# Patient Record
Sex: Female | Born: 1990 | Race: White | Hispanic: No | Marital: Married | Smoking: Never smoker
Health system: Southern US, Community
[De-identification: ages and names within clinical notes are randomized; demographics above are authoritative.]

## PROBLEM LIST (undated history)

## (undated) DIAGNOSIS — J45909 Unspecified asthma, uncomplicated: Secondary | ICD-10-CM

## (undated) HISTORY — DX: Unspecified asthma, uncomplicated: J45.909

## (undated) HISTORY — PX: WISDOM TOOTH EXTRACTION: SHX21

---
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
## 2010-06-21 LAB — URINALYSIS, ROUTINE W REFLEX MICROSCOPIC
Bilirubin Urine: NEGATIVE
Glucose, UA: NEGATIVE mg/dL
Ketones, ur: NEGATIVE mg/dL
Nitrite: NEGATIVE
Protein, ur: NEGATIVE mg/dL
pH: 6.5 (ref 5.0–8.0)

## 2010-06-21 LAB — RPR: RPR Ser Ql: NONREACTIVE

## 2010-06-21 LAB — CBC
HCT: 32.6 % — ABNORMAL LOW (ref 36.0–46.0)
Hemoglobin: 11.4 g/dL — ABNORMAL LOW (ref 12.0–15.0)
MCHC: 33.6 g/dL (ref 30.0–36.0)
MCV: 87.2 fL (ref 78.0–100.0)
Platelets: 224 10*3/uL (ref 150–400)
RDW: 14.8 % (ref 11.5–15.5)
WBC: 8.4 10*3/uL (ref 4.0–10.5)

## 2010-06-21 LAB — COMPREHENSIVE METABOLIC PANEL
ALT: 13 U/L (ref 0–35)
Alkaline Phosphatase: 165 U/L — ABNORMAL HIGH (ref 39–117)
BUN: 12 mg/dL (ref 6–23)
CO2: 21 mEq/L (ref 19–32)
GFR calc non Af Amer: >60 mL/min (ref >60)
Glucose, Bld: 88 mg/dL (ref 70–99)
Potassium: 4.1 mEq/L (ref 3.5–5.1)
Sodium: 135 mEq/L (ref 135–145)
Total Bilirubin: 0.3 mg/dL (ref 0.3–1.2)

## 2010-06-21 LAB — URINE MICROSCOPIC-ADD ON

## 2016-06-01 ENCOUNTER — Other Ambulatory Visit: Payer: Self-pay | Admitting: Obstetrics & Gynecology

## 2016-06-01 DIAGNOSIS — O3680X0 Pregnancy with inconclusive fetal viability, not applicable or unspecified: Secondary | ICD-10-CM

## 2016-06-02 ENCOUNTER — Ambulatory Visit (INDEPENDENT_AMBULATORY_CARE_PROVIDER_SITE_OTHER): Payer: Medicaid Other

## 2016-06-02 DIAGNOSIS — Z3A08 8 weeks gestation of pregnancy: Secondary | ICD-10-CM

## 2016-06-02 DIAGNOSIS — O3491 Maternal care for abnormality of pelvic organ, unspecified, first trimester: Secondary | ICD-10-CM

## 2016-06-02 DIAGNOSIS — O3680X0 Pregnancy with inconclusive fetal viability, not applicable or unspecified: Secondary | ICD-10-CM | POA: Diagnosis not present

## 2016-06-02 NOTE — Progress Notes (Signed)
US 8+3 wks,single IUP w/ys, pos fht 162 bpm,normal right ovary,simple left corpus luteal cyst 3 x 2.4 x 2.5 cm,EDD 01/09/2017 by [facility]

## 2016-06-16 ENCOUNTER — Encounter: Payer: Self-pay | Admitting: Advanced Practice Midwife

## 2016-06-16 ENCOUNTER — Telehealth: Payer: Self-pay | Admitting: *Deleted

## 2016-06-16 ENCOUNTER — Ambulatory Visit (INDEPENDENT_AMBULATORY_CARE_PROVIDER_SITE_OTHER): Payer: Medicaid Other | Admitting: Advanced Practice Midwife

## 2016-06-16 ENCOUNTER — Ambulatory Visit: Payer: Medicaid Other | Admitting: *Deleted

## 2016-06-16 VITALS — BP 118/86 | HR 92 | Ht 60.0 in | Wt 162.0 lb

## 2016-06-16 DIAGNOSIS — Z3A10 10 weeks gestation of pregnancy: Secondary | ICD-10-CM | POA: Diagnosis not present

## 2016-06-16 DIAGNOSIS — Z1389 Encounter for screening for other disorder: Secondary | ICD-10-CM

## 2016-06-16 DIAGNOSIS — O99511 Diseases of the respiratory system complicating pregnancy, first trimester: Secondary | ICD-10-CM

## 2016-06-16 DIAGNOSIS — O210 Mild hyperemesis gravidarum: Secondary | ICD-10-CM

## 2016-06-16 DIAGNOSIS — Z3480 Encounter for supervision of other normal pregnancy, unspecified trimester: Secondary | ICD-10-CM | POA: Insufficient documentation

## 2016-06-16 DIAGNOSIS — J4520 Mild intermittent asthma, uncomplicated: Secondary | ICD-10-CM

## 2016-06-16 DIAGNOSIS — Z3481 Encounter for supervision of other normal pregnancy, first trimester: Secondary | ICD-10-CM

## 2016-06-16 DIAGNOSIS — J45909 Unspecified asthma, uncomplicated: Secondary | ICD-10-CM | POA: Insufficient documentation

## 2016-06-16 DIAGNOSIS — Z331 Pregnant state, incidental: Secondary | ICD-10-CM

## 2016-06-16 LAB — POCT URINALYSIS DIPSTICK
Blood, UA: NEGATIVE
GLUCOSE UA: NEGATIVE
Ketones, UA: NEGATIVE
Leukocytes, UA: NEGATIVE
NITRITE UA: NEGATIVE

## 2016-06-16 NOTE — Telephone Encounter (Signed)
She is only 10 weeks, we talked about risks and although she is ok with them, I am not. She can ask the person wh Robynn Paneorigianlly gave it to her ([facility]?) if they want to refill it

## 2016-06-16 NOTE — Patient Instructions (Signed)
 First Trimester of Pregnancy The first trimester of pregnancy is from week 1 until the end of week 12 (months 1 through 3). A week after a sperm fertilizes an egg, the egg will implant on the wall of the uterus. This embryo will begin to develop into a baby. Genes from you and your partner are forming the baby. The female genes determine whether the baby is a boy or a girl. At 6-8 weeks, the eyes and face are formed, and the heartbeat can be seen on ultrasound. At the end of 12 weeks, all the baby's organs are formed.  Now that you are pregnant, you will want to do everything you can to have a healthy baby. Two of the most important things are to get good prenatal care and to follow your health care provider's instructions. Prenatal care is all the medical care you receive before the baby's birth. This care will help prevent, find, and treat any problems during the pregnancy and childbirth. BODY CHANGES Your body goes through many changes during pregnancy. The changes vary from woman to woman.   You may gain or lose a couple of pounds at first.  You may feel sick to your stomach (nauseous) and throw up (vomit). If the vomiting is uncontrollable, call your health care provider.  You may tire easily.  You may develop headaches that can be relieved by medicines approved by your health care provider.  You may urinate more often. Painful urination may mean you have a bladder infection.  You may develop heartburn as a result of your pregnancy.  You may develop constipation because certain hormones are causing the muscles that push waste through your intestines to slow down.  You may develop hemorrhoids or swollen, bulging veins (varicose veins).  Your breasts may begin to grow larger and become tender. Your nipples may stick out more, and the tissue that surrounds them (areola) may become darker.  Your gums may bleed and may be sensitive to brushing and flossing.  Dark spots or blotches  (chloasma, mask of pregnancy) may develop on your face. This will likely fade after the baby is born.  Your menstrual periods will stop.  You may have a loss of appetite.  You may develop cravings for certain kinds of food.  You may have changes in your emotions from day to day, such as being excited to be pregnant or being concerned that something may go wrong with the pregnancy and baby.  You may have more vivid and strange dreams.  You may have changes in your hair. These can include thickening of your hair, rapid growth, and changes in texture. Some women also have hair loss during or after pregnancy, or hair that feels dry or thin. Your hair will most likely return to normal after your baby is born. WHAT TO EXPECT AT YOUR PRENATAL VISITS During a routine prenatal visit:  You will be weighed to make sure you and the baby are growing normally.  Your blood pressure will be taken.  Your abdomen will be measured to track your baby's growth.  The fetal heartbeat will be listened to starting around week 10 or 12 of your pregnancy.  Test results from any previous visits will be discussed. Your health care provider may ask you:  How you are feeling.  If you are feeling the baby move.  If you have had any abnormal symptoms, such as leaking fluid, bleeding, severe headaches, or abdominal cramping.  If you have any questions. Other   tests that may be performed during your first trimester include:  Blood tests to find your blood type and to check for the presence of any previous infections. They will also be used to check for low iron levels (anemia) and Rh antibodies. Later in the pregnancy, blood tests for diabetes will be done along with other tests if problems develop.  Urine tests to check for infections, diabetes, or protein in the urine.  An ultrasound to confirm the proper growth and development of the baby.  An amniocentesis to check for possible genetic problems.  Fetal  screens for spina bifida and Down syndrome.  You may need other tests to make sure you and the baby are doing well. HOME CARE INSTRUCTIONS  Medicines  Follow your health care provider's instructions regarding medicine use. Specific medicines may be either safe or unsafe to take during pregnancy.  Take your prenatal vitamins as directed.  If you develop constipation, try taking a stool softener if your health care provider approves. Diet  Eat regular, well-balanced meals. Choose a variety of foods, such as meat or vegetable-based protein, fish, milk and low-fat dairy products, vegetables, fruits, and whole grain breads and cereals. Your health care provider will help you determine the amount of weight gain that is right for you.  Avoid raw meat and uncooked cheese. These carry germs that can cause birth defects in the baby.  Eating four or five small meals rather than three large meals a day may help relieve nausea and vomiting. If you start to feel nauseous, eating a few soda crackers can be helpful. Drinking liquids between meals instead of during meals also seems to help nausea and vomiting.  If you develop constipation, eat more high-fiber foods, such as fresh vegetables or fruit and whole grains. Drink enough fluids to keep your urine clear or pale yellow. Activity and Exercise  Exercise only as directed by your health care provider. Exercising will help you:  Control your weight.  Stay in shape.  Be prepared for labor and delivery.  Experiencing pain or cramping in the lower abdomen or low back is a good sign that you should stop exercising. Check with your health care provider before continuing normal exercises.  Try to avoid standing for long periods of time. Move your legs often if you must stand in one place for a long time.  Avoid heavy lifting.  Wear low-heeled shoes, and practice good posture.  You may continue to have sex unless your health care provider directs you  otherwise. Relief of Pain or Discomfort  Wear a good support bra for breast tenderness.   Take warm sitz baths to soothe any pain or discomfort caused by hemorrhoids. Use hemorrhoid cream if your health care provider approves.   Rest with your legs elevated if you have leg cramps or low back pain.  If you develop varicose veins in your legs, wear support hose. Elevate your feet for 15 minutes, 3-4 times a day. Limit salt in your diet. Prenatal Care  Schedule your prenatal visits by the twelfth week of pregnancy. They are usually scheduled monthly at first, then more often in the last 2 months before delivery.  Write down your questions. Take them to your prenatal visits.  Keep all your prenatal visits as directed by your health care provider. Safety  Wear your seat belt at all times when driving.  Make a list of emergency phone numbers, including numbers for family, friends, the hospital, and police and fire departments. General   Tips  Ask your health care provider for a referral to a local prenatal education class. Begin classes no later than at the beginning of month 6 of your pregnancy.  Ask for help if you have counseling or nutritional needs during pregnancy. Your health care provider can offer advice or refer you to specialists for help with various needs.  Do not use hot tubs, steam rooms, or saunas.  Do not douche or use tampons or scented sanitary pads.  Do not cross your legs for long periods of time.  Avoid cat litter boxes and soil used by cats. These carry germs that can cause birth defects in the baby and possibly loss of the fetus by miscarriage or stillbirth.  Avoid all smoking, herbs, alcohol, and medicines not prescribed by your health care provider. Chemicals in these affect the formation and growth of the baby.  Schedule a dentist appointment. At home, brush your teeth with a soft toothbrush and be gentle when you floss. SEEK MEDICAL CARE IF:   You have  dizziness.  You have mild pelvic cramps, pelvic pressure, or nagging pain in the abdominal area.  You have persistent nausea, vomiting, or diarrhea.  You have a bad smelling vaginal discharge.  You have pain with urination.  You notice increased swelling in your face, hands, legs, or ankles. SEEK IMMEDIATE MEDICAL CARE IF:   You have a fever.  You are leaking fluid from your vagina.  You have spotting or bleeding from your vagina.  You have severe abdominal cramping or pain.  You have rapid weight gain or loss.  You vomit blood or material that looks like coffee grounds.  You are exposed to German measles and have never had them.  You are exposed to fifth disease or chickenpox.  You develop a severe headache.  You have shortness of breath.  You have any kind of trauma, such as from a fall or a car accident. Document Released: 03/16/2001 Document Revised: 08/06/2013 Document Reviewed: 01/30/2013 Exit[employer] Patient Information 2015 [employer]. This information is not intended to replace advice given to you by your health care provider. Make sure you discuss any questions you have with your health care provider.   Nausea & Vomiting  Have saltine crackers or pretzels by your bed and eat a few bites before you raise your head out of bed in the morning  Eat small frequent meals throughout the day instead of large meals  Drink plenty of fluids throughout the day to stay hydrated, just don't drink a lot of fluids with your meals.  This can make your stomach fill up faster making you feel sick  Do not brush your teeth right after you eat  Products with real ginger are good for nausea, like ginger ale and ginger hard candy Make sure it says made with real ginger!  Sucking on sour candy like lemon heads is also good for nausea  If your prenatal vitamins make you nauseated, take them at night so you will sleep through the nausea  Sea Bands  If you feel like you need  medicine for the nausea & vomiting please let us know  If you are unable to keep any fluids or food down please let us know   Constipation  Drink plenty of fluid, preferably water, throughout the day  Eat foods high in fiber such as fruits, vegetables, and grains  Exercise, such as walking, is a good way to keep your bowels regular  Drink warm fluids, especially warm   prune juice, or decaf coffee  Eat a 1/2 cup of real oatmeal (not instant), 1/2 cup applesauce, and 1/2-1 cup warm prune juice every day  If needed, you may take Colace (docusate sodium) stool softener once or twice a day to help keep the stool soft. If you are pregnant, wait until you are out of your first trimester (12-14 weeks of pregnancy)  If you still are having problems with constipation, you may take Miralax once daily as needed to help keep your bowels regular.  If you are pregnant, wait until you are out of your first trimester (12-14 weeks of pregnancy)  Safe Medications in Pregnancy   Acne: Benzoyl Peroxide Salicylic Acid  Backache/Headache: Tylenol: 2 regular strength every 4 hours OR              2 Extra strength every 6 hours  Colds/Coughs/Allergies: Benadryl (alcohol free) 25 mg every 6 hours as needed Breath right strips Claritin Cepacol throat lozenges Chloraseptic throat spray Cold-Eeze- up to three times per day Cough drops, alcohol free Flonase (by prescription only) Guaifenesin Mucinex Robitussin DM (plain only, alcohol free) Saline nasal spray/drops Sudafed (pseudoephedrine) & Actifed ** use only after [redacted] weeks gestation and if you do not have high blood pressure Tylenol Vicks Vaporub Zinc lozenges Zyrtec   Constipation: Colace Ducolax suppositories Fleet enema Glycerin suppositories Metamucil Milk of magnesia Miralax Senokot Smooth move tea  Diarrhea: Kaopectate Imodium A-D  *NO pepto Bismol  Hemorrhoids: Anusol Anusol HC Preparation  H Tucks  Indigestion: Tums Maalox Mylanta Zantac  Pepcid  Insomnia: Benadryl (alcohol free) 25mg every 6 hours as needed Tylenol PM Unisom, no Gelcaps  Leg Cramps: Tums MagGel  Nausea/Vomiting:  Bonine Dramamine Emetrol Ginger extract Sea bands Meclizine  Nausea medication to take during pregnancy:  Unisom (doxylamine succinate 25 mg tablets) Take one tablet daily at bedtime. If symptoms are not adequately controlled, the dose can be increased to a maximum recommended dose of two tablets daily (1/2 tablet in the morning, 1/2 tablet mid-afternoon and one at bedtime). Vitamin B6 100mg tablets. Take one tablet twice a day (up to 200 mg per day).  Skin Rashes: Aveeno products Benadryl cream or 25mg every 6 hours as needed Calamine Lotion 1% cortisone cream  Yeast infection: Gyne-lotrimin 7 Monistat 7   **If taking multiple medications, please check labels to avoid duplicating the same active ingredients **take medication as directed on the label ** Do not exceed 4000 mg of tylenol in 24 hours **Do not take medications that contain aspirin or ibuprofen      

## 2016-06-16 NOTE — Progress Notes (Signed)
  Subjective:    Nancy Thompson is a [id number] 849w3d being seen today for her first obstetrical visit.  Her obstetrical history is significant for term SVD X 3.  Pregnancy history fully reviewed.  Patient reports nausea.[facility]  Pt aware of 1st trimester risks.   Vitals:   06/16/16 1125 06/16/16 1126  BP: 118/86   Pulse: 92   Weight: 162 lb (73.5 kg)   Height:  5' (1.524 m)    HISTORY: OB History  Gravida Para Term Preterm AB Living  4 3 3     3   SAB TAB Ectopic Multiple Live Births          3    # Outcome Date GA Lbr Len/2nd Weight Sex Delivery Anes PTL Lv  4 Current           3 Term 01/15/14 3745w5d  6 lb 7 oz (2.92 kg) F Vag-Spont EPI N LIV  2 Term 11/19/11 7345w5d  6 lb 13 oz (3.09 kg) F Vag-Spont EPI N LIV  1 Term 09/25/09 1845w0d  5 lb 10 oz (2.551 kg) F Vag-Spont Other N LIV     Past Medical History:  Diagnosis Date  . Asthma    Past Surgical History:  Procedure Laterality Date  . WISDOM TOOTH EXTRACTION     Family History  Problem Relation Age of Onset  . Diabetes Mother   . Cancer Maternal Grandfather     melanoma  . Congestive Heart Failure Paternal Grandmother      Exam                                      System:     Skin: normal coloration and turgor, no rashes    Neurologic: oriented, normal, normal mood   Extremities: normal strength, tone, and muscle mass   HEENT PERRLA   Mouth/Teeth mucous membranes moist, normal dentition   Neck supple and no masses   Cardiovascular: regular rate and rhythm   Respiratory:  appears well, vitals normal, no respiratory distress, acyanotic   Abdomen: soft, non-tender;  FHR: 150 US          Assessment:    Pregnancy: [id number] Patient Active Problem List   Diagnosis Date Noted  . Encounter for supervision of other normal pregnancy 06/16/2016  . Asthma 06/16/2016        Plan:     Initial labs drawn. Continue prenatal vitamins  Problem list reviewed and  updated  Reviewed n/v relief measures and warning s/s to report  Reviewed recommended weight gain based on pre-gravid BMI  Encouraged well-balanced diet Genetic Screening discussed Integrated Screen: declined.  Ultrasound discussed; fetal survey: requested.  Return in about 4 weeks (around 07/14/2016) for LROB.  CRESENZO-DISHMAN,Elgene Coral 06/16/2016

## 2016-06-16 NOTE — Telephone Encounter (Signed)
Informed patient response from Drenda FreezeFran was as follows "She is only 10 weeks, we talked about risks and although she is ok with them, I am not. She can ask the person wh Robynn Paneorigianlly gave it to her ([facility]?) if they want to refill it". Pt stated ok.

## 2016-06-16 NOTE — Telephone Encounter (Signed)
Wants prescription for Zofran.

## 2016-06-17 LAB — VARICELLA ZOSTER ANTIBODY, IGG: Varicella zoster IgG: 1741 index (ref >165)

## 2016-06-17 LAB — ABO/RH: RH TYPE: POSITIVE

## 2016-06-17 LAB — CBC
HEMATOCRIT: 36.2 % (ref 34.0–46.6)
Hemoglobin: 12.1 g/dL (ref 11.1–15.9)
MCH: 29.1 pg (ref 26.6–33.0)
MCHC: 33.4 g/dL (ref 31.5–35.7)
MCV: 87 fL (ref 79–97)
Platelets: 315 10*3/uL (ref 150–379)
RBC: 4.16 x10E6/uL (ref 3.77–5.28)
RDW: 14.3 % (ref 12.3–15.4)
WBC: 9.1 10*3/uL (ref 3.4–10.8)

## 2016-06-17 LAB — URINALYSIS, ROUTINE W REFLEX MICROSCOPIC
Bilirubin, UA: NEGATIVE
Glucose, UA: NEGATIVE
KETONES UA: NEGATIVE
Leukocytes, UA: NEGATIVE
NITRITE UA: NEGATIVE
RBC UA: NEGATIVE
SPEC GRAV UA: 1.029 (ref 1.005–1.030)
Urobilinogen, Ur: 0.2 mg/dL (ref 0.2–1.0)
pH, UA: 8 — ABNORMAL HIGH (ref 5.0–7.5)

## 2016-06-17 LAB — HIV ANTIBODY (ROUTINE TESTING W REFLEX): HIV Screen 4th Generation wRfx: NONREACTIVE

## 2016-06-17 LAB — GC/CHLAMYDIA PROBE AMP
Chlamydia trachomatis, NAA: NEGATIVE
NEISSERIA GONORRHOEAE BY PCR: NEGATIVE

## 2016-06-17 LAB — PMP SCREEN PROFILE (10S), URINE
AMPHETAMINE SCRN UR: NEGATIVE ng/mL
Barbiturate Screen, Ur: NEGATIVE ng/mL
Benzodiazepine Screen, Urine: NEGATIVE ng/mL
CANNABINOIDS UR QL SCN: NEGATIVE ng/mL
Cocaine(Metab.)Screen, Urine: NEGATIVE ng/mL
Creatinine(Crt), U: 232.4 mg/dL (ref 20.0–300.0)
Methadone Scn, Ur: NEGATIVE ng/mL
OPIATE SCRN UR: NEGATIVE ng/mL
Oxycodone+Oxymorphone Ur Ql Scn: NEGATIVE ng/mL
PCP SCRN UR: NEGATIVE ng/mL
PH UR, DRUG SCRN: 7.9 (ref 4.5–8.9)
Propoxyphene, Screen: NEGATIVE ng/mL

## 2016-06-17 LAB — RPR: RPR: NONREACTIVE

## 2016-06-17 LAB — RUBELLA SCREEN: RUBELLA: 5.14 {index} (ref >0.99)

## 2016-06-17 LAB — ANTIBODY SCREEN: ANTIBODY SCREEN: NEGATIVE

## 2016-06-17 LAB — HEPATITIS B SURFACE ANTIGEN: Hepatitis B Surface Ag: NEGATIVE

## 2016-06-18 LAB — URINE CULTURE

## 2016-07-14 ENCOUNTER — Encounter: Payer: Medicaid Other | Admitting: Obstetrics and Gynecology

## 2017-04-03 ENCOUNTER — Encounter (HOSPITAL_COMMUNITY): Payer: Self-pay

## 2017-11-14 ENCOUNTER — Other Ambulatory Visit: Payer: Self-pay | Admitting: *Deleted

## 2017-11-14 ENCOUNTER — Ambulatory Visit
Admission: RE | Admit: 2017-11-14 | Discharge: 2017-11-14 | Disposition: A | Discharge status: Home / Self Care | Payer: Medicaid Other | Source: Ambulatory Visit | Attending: Oncology | Admitting: Oncology

## 2017-11-14 ENCOUNTER — Encounter: Payer: Self-pay | Admitting: *Deleted

## 2017-11-14 ENCOUNTER — Ambulatory Visit: Payer: Self-pay | Attending: Oncology | Admitting: *Deleted

## 2017-11-14 VITALS — BP 138/90 | HR 94 | Temp 98.0°F | Ht 62.0 in | Wt 183.0 lb

## 2017-11-14 DIAGNOSIS — N63 Unspecified lump in unspecified breast: Secondary | ICD-10-CM

## 2017-11-14 NOTE — Progress Notes (Signed)
  Subjective:     Patient ID: Nancy Thompson, female   DOB: Jun 20, 1990, 27 y.o.   MRN: [id number]  HPI   Review of Systems     Objective:   Physical Exam  Pulmonary/Chest:         Assessment:     27 year old White [facility] of a right breast mass at 6:00.  Patient states she found the lump on self exam in May.  States she feels like the mass is getting larger and that's why she went to the clinic.  States sometimes the nodule "aches", but no discrete pain.  No aggravating or alleviating factors. She has 4 children ages 248 and under.  Youngest child is 6510 months old.  States she did not breast feed.  No family history of cancer.  On clinical breast exam I can palpate a lobulated mobile, firm, 2-3 cm nodule at 6:00 right breast at the edge of the areola.  There is also palpable fibroglandular like tissue bilateral.  Taught self breast awareness.  Patient has been screened for eligibility.  She does not have any insurance, Medicare or Medicaid.  She also meets financial eligibility.  Hand-out given on the Affordable Care Act.    Plan:     Ulrasound of ordered of the right breast per protocol for a 27 year.  If no findings on imaging will refer for surgical consult.  Patient is agreeable to the plan.  Will follow-up per [facility] protocol.

## 2017-11-14 NOTE — Patient Instructions (Signed)
Gave patient hand-out, Women Staying Healthy, Active and Well from [facility], with education on breast health, pap smears, heart and colon health. 

## 2017-11-16 ENCOUNTER — Ambulatory Visit
Admission: RE | Admit: 2017-11-16 | Discharge: 2017-11-16 | Disposition: A | Discharge status: Home / Self Care | Payer: Medicaid Other | Source: Ambulatory Visit | Attending: Oncology | Admitting: Oncology

## 2017-11-16 DIAGNOSIS — N63 Unspecified lump in unspecified breast: Secondary | ICD-10-CM | POA: Insufficient documentation

## 2017-11-17 LAB — SURGICAL PATHOLOGY

## 2017-12-28 ENCOUNTER — Encounter: Payer: Self-pay | Admitting: *Deleted

## 2017-12-28 NOTE — Progress Notes (Signed)
Patient had benign biopsy.  She is to follow-up with screening mammogram at age 27.  [facility] to [place].

## 2018-04-04 ENCOUNTER — Inpatient Hospital Stay (HOSPITAL_COMMUNITY)
Admission: EM | Admit: 2018-04-04 | Discharge: 2018-04-06 | DRG: 093 | Disposition: A | Discharge status: Home / Self Care | Payer: Medicaid Other | Attending: Internal Medicine | Admitting: Internal Medicine

## 2018-04-04 ENCOUNTER — Encounter (HOSPITAL_COMMUNITY): Payer: Self-pay

## 2018-04-04 ENCOUNTER — Other Ambulatory Visit: Payer: Self-pay

## 2018-04-04 ENCOUNTER — Emergency Department (HOSPITAL_COMMUNITY): Payer: Medicaid Other

## 2018-04-04 DIAGNOSIS — Z7951 Long term (current) use of inhaled steroids: Secondary | ICD-10-CM | POA: Diagnosis not present

## 2018-04-04 DIAGNOSIS — R200 Anesthesia of skin: Principal | ICD-10-CM | POA: Diagnosis present

## 2018-04-04 DIAGNOSIS — Z882 Allergy status to sulfonamides status: Secondary | ICD-10-CM

## 2018-04-04 DIAGNOSIS — R1310 Dysphagia, unspecified: Secondary | ICD-10-CM | POA: Diagnosis present

## 2018-04-04 DIAGNOSIS — Z91040 Latex allergy status: Secondary | ICD-10-CM | POA: Diagnosis not present

## 2018-04-04 DIAGNOSIS — J45909 Unspecified asthma, uncomplicated: Secondary | ICD-10-CM | POA: Diagnosis present

## 2018-04-04 DIAGNOSIS — R202 Paresthesia of skin: Secondary | ICD-10-CM

## 2018-04-04 LAB — CBC WITH DIFFERENTIAL/PLATELET
ABS IMMATURE GRANULOCYTES: 0.01 10*3/uL (ref 0.00–0.07)
Basophils Absolute: 0.1 10*3/uL (ref 0.0–0.1)
Basophils Relative: 1 %
Eosinophils Absolute: 0.1 10*3/uL (ref 0.0–0.5)
Eosinophils Relative: 2 %
HEMATOCRIT: 38.6 % (ref 36.0–46.0)
HEMOGLOBIN: 12.8 g/dL (ref 12.0–15.0)
IMMATURE GRANULOCYTES: 0 %
LYMPHS ABS: 2.1 10*3/uL (ref 0.7–4.0)
Lymphocytes Relative: 32 %
MCH: 29.4 pg (ref 26.0–34.0)
MCHC: 33.2 g/dL (ref 30.0–36.0)
MCV: 88.5 fL (ref 80.0–100.0)
MONO ABS: 0.4 10*3/uL (ref 0.1–1.0)
MONOS PCT: 6 %
NEUTROS ABS: 3.9 10*3/uL (ref 1.7–7.7)
NEUTROS PCT: 59 %
Platelets: 352 10*3/uL (ref 150–400)
RBC: 4.36 MIL/uL (ref 3.87–5.11)
RDW: 13.7 % (ref 11.5–15.5)
WBC: 6.6 10*3/uL (ref 4.0–10.5)
nRBC: 0 % (ref 0.0–0.2)

## 2018-04-04 LAB — CSF CELL COUNT WITH DIFFERENTIAL
RBC COUNT CSF: 2 /mm3 — AB
RBC Count, CSF: 0 /mm3
Tube #: 1
Tube #: 4
WBC, CSF: 1 /mm3 (ref 0–5)
WBC, CSF: 1 /mm3 (ref 0–5)

## 2018-04-04 LAB — TSH: TSH: 0.954 u[IU]/mL (ref 0.350–4.500)

## 2018-04-04 LAB — BASIC METABOLIC PANEL
Anion gap: 7 (ref 5–15)
BUN: 14 mg/dL (ref 6–20)
CO2: 25 mmol/L (ref 22–32)
Calcium: 9.4 mg/dL (ref 8.9–10.3)
Chloride: 106 mmol/L (ref 98–111)
Creatinine, Ser: 0.82 mg/dL (ref 0.44–1.00)
GFR calc Af Amer: >60 mL/min (ref >60)
GLUCOSE: 90 mg/dL (ref 70–99)
POTASSIUM: 4 mmol/L (ref 3.5–5.1)
Sodium: 138 mmol/L (ref 135–145)

## 2018-04-04 LAB — CBC
HCT: 40.2 % (ref 36.0–46.0)
Hemoglobin: 13.4 g/dL (ref 12.0–15.0)
MCH: 29.7 pg (ref 26.0–34.0)
MCHC: 33.3 g/dL (ref 30.0–36.0)
MCV: 89.1 fL (ref 80.0–100.0)
Platelets: 380 10*3/uL (ref 150–400)
RBC: 4.51 MIL/uL (ref 3.87–5.11)
RDW: 13.5 % (ref 11.5–15.5)
WBC: 8.7 10*3/uL (ref 4.0–10.5)
nRBC: 0 % (ref 0.0–0.2)

## 2018-04-04 LAB — PROTEIN AND GLUCOSE, CSF
GLUCOSE CSF: 54 mg/dL (ref 40–70)
Total  Protein, CSF: 19 mg/dL (ref 15–45)

## 2018-04-04 LAB — HEMOGLOBIN A1C
Hgb A1c MFr Bld: 4.7 % — ABNORMAL LOW (ref 4.8–5.6)
Mean Plasma Glucose: 88.19 mg/dL

## 2018-04-04 LAB — CREATININE, SERUM
Creatinine, Ser: 0.97 mg/dL (ref 0.44–1.00)
GFR calc Af Amer: >60 mL/min (ref >60)
GFR calc non Af Amer: >60 mL/min (ref >60)

## 2018-04-04 LAB — MAGNESIUM: Magnesium: 2 mg/dL (ref 1.7–2.4)

## 2018-04-04 LAB — VITAMIN B12: Vitamin B-12: 255 pg/mL (ref 180–914)

## 2018-04-04 MED ORDER — HEPARIN SODIUM (PORCINE) 5000 UNIT/ML IJ SOLN
5000.0000 [IU] | Freq: Three times a day (TID) | INTRAMUSCULAR | Status: DC
  Administered 2018-04-04 – 2018-04-06 (×6): 5000 [IU] via SUBCUTANEOUS
  Filled 2018-04-04 (×6): qty 1

## 2018-04-04 MED ORDER — SODIUM CHLORIDE 0.9% FLUSH: 3.0000 mL | INTRAVENOUS | Status: DC | PRN

## 2018-04-04 MED ORDER — ONDANSETRON HCL 4 MG PO TABS: 8.0000 mg | ORAL_TABLET | Freq: Three times a day (TID) | ORAL | Status: DC | PRN

## 2018-04-04 MED ORDER — SODIUM CHLORIDE 0.9 % IV SOLN: 250.0000 mL | INTRAVENOUS | Status: DC | PRN

## 2018-04-04 MED ORDER — ACETAMINOPHEN 650 MG RE SUPP: 650.0000 mg | Freq: Four times a day (QID) | RECTAL | Status: DC | PRN

## 2018-04-04 MED ORDER — LIDOCAINE-EPINEPHRINE (PF) 2 %-1:200000 IJ SOLN
10.0000 mL | Freq: Once | INTRAMUSCULAR | Status: AC
  Administered 2018-04-04: 10 mL
  Filled 2018-04-04: qty 10

## 2018-04-04 MED ORDER — SODIUM CHLORIDE 0.9% FLUSH
3.0000 mL | Freq: Two times a day (BID) | INTRAVENOUS | Status: DC
  Administered 2018-04-04 – 2018-04-06 (×4): 3 mL via INTRAVENOUS

## 2018-04-04 MED ORDER — ACETAMINOPHEN 325 MG PO TABS
650.0000 mg | ORAL_TABLET | Freq: Four times a day (QID) | ORAL | Status: DC | PRN
  Administered 2018-04-05 (×2): 650 mg via ORAL
  Filled 2018-04-04 (×2): qty 2

## 2018-04-04 NOTE — ED Notes (Signed)
CT at bedside.   Pt voided but did not obtain urine sample. Pt aware need urine specimen.

## 2018-04-04 NOTE — ED Triage Notes (Signed)
Pt is having bilateral arm and face numbness as well as dizziness for the last 3 days. States her grip in her right side is weaker than the left. No trouble swallowing, eating, or walking.

## 2018-04-04 NOTE — ED Notes (Signed)
Teleneurology cart at bedside  

## 2018-04-04 NOTE — H&P (Signed)
History and Physical    Stanton Kidneyabitha Blea AVW:[id number] DOB: [birth year] DOA: 04/04/2018  PCP: Montez Hagemanurner, War C, DO  Patient coming from: Home  I have personally briefly reviewed patient[facility]  Chief Complaint: Numbness  HPI: Stanton Kidneyabitha Lotito is a 27 y.o. female with medical history significant of Asthma who came came to the hospital with chief complaint of numbness.  History of present illness dates back to a year ago when patient had an episode of facial numbness which patient explained lasted around 20 minutes and then went away. Patient started having facial numbness again this past Sunday it started on her right side of the face and then went on to both side of the face. Patient explained that numbness later went to went onto her arms on both sides.  Patient is numbness was intermittent but was getting progressively worse. Patient also complains of weakness of her both hands. Patient also gives a history of having difficulty swallowing both liquids and solids No Complaint of regurgitation but patient states that she does not feel her swallowing is normal. 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    Past Medical History:  Diagnosis Date  . Asthma     Past Surgical History:  Procedure Laterality Date  . WISDOM TOOTH EXTRACTION      Social History:  reports that she has never smoked. She has never used smokeless tobacco. She reports that she does not drink alcohol or use drugs.  Allergies    Allergen Reactions  . Dairy Aid [Lactase] Nausea Only  . Sulfa Antibiotics   . Latex Rash    Family History  Problem Relation Age of Onset  . Diabetes Mother   . Cancer Maternal Grandfather        melanoma  . Congestive Heart Failure Paternal Grandmother      Prior to Admission medications   Medication Sig Start Date End Date Taking? Authorizing Provider  albuterol (PROVENTIL HFA;VENTOLIN HFA) 108 (90 Base) MCG/ACT inhaler Inhale into the lungs every 6 (six) hours as needed for wheezing or shortness of breath.    [provider]  ondansetron (ZOFRAN) 4 MG tablet Take 8 mg by mouth every 8 (eight) hours as needed for nausea or vomiting.    [provider]  Prenatal Vit-Fe Fumarate-FA (MULTIVITAMIN-PRENATAL) 27-0.8 MG TABS tablet Take 1 tablet by mouth daily at 12 noon.    [provider]    Physical Exam: Vitals:   04/04/18 1117 04/04/18 1348 04/04/18 1359 04/04/18 1405  BP: (!) 125/104 (!) 148/117    Pulse: 91 (!) 105 86 93  Resp: 20 14 13 16   Temp: 97.9 F (36.6 C)     TempSrc: Oral     SpO2: 100% 99% 96% 98%  Weight:      Height:        Constitutional: NAD, calm, comfortable Eyes: PERRL, lids and conjunctivae normal ENMT: Mucous membranes are moist. Posterior pharynx clear of any exudate or lesions.Normal dentition.  Neck: normal, supple, no masses, no thyromegaly Respiratory:  clear to auscultation bilaterally, no wheezing, no crackles. Normal respiratory effort. No accessory muscle use.  Cardiovascular: Regular rate and rhythm, no murmurs / rubs / gallops. No extremity edema. 2+ pedal pulses. No carotid bruits.  Abdomen: no tenderness, no masses palpated. No hepatosplenomegaly. Bowel sounds positive.  Musculoskeletal: no clubbing / cyanosis. No joint deformity upper and lower extremities. Good ROM, no contractures. Normal muscle tone.  Skin: no rashes, lesions, ulcers. No induration Neurologic: CN 2-12 grossly intact. Sensation intact, DTR  normal. Strength 5/5 in all 4.  Psychiatric: Normal judgment and insight. Alert and oriented x 3. Normal mood.    Labs on Admission: I have personally reviewed following labs and imaging studies  CBC: Recent Labs  Lab 04/04/18 1406  WBC 6.6  NEUTROABS 3.9  HGB 12.8  HCT 38.6  MCV 88.5  PLT 352   Basic Metabolic Panel: Recent Labs  Lab 04/04/18 1406  NA 138  K 4.0  CL 106  CO2 25  GLUCOSE 90  BUN 14  CREATININE 0.82  CALCIUM 9.4  MG 2.0   Urine analysis:    Component Value Date/Time   COLORURINE YELLOW 09/25/2009 0705   APPEARANCEUR Turbid (A) 06/16/2016 1241   LABSPEC 1.025 09/25/2009 0705   PHURINE 6.5 09/25/2009 0705   GLUCOSEU Negative 06/16/2016 1241   HGBUR TRACE (A) 09/25/2009 0705   BILIRUBINUR Negative 06/16/2016 1241   KETONESUR NEGATIVE 09/25/2009 0705   PROTEINUR Trace 06/16/2016 1241   PROTEINUR NEGATIVE 09/25/2009 0705   UROBILINOGEN 0.2 09/25/2009 0705   NITRITE Negative 06/16/2016 1241   NITRITE NEGATIVE 09/25/2009 0705   LEUKOCYTESUR Negative 06/16/2016 1241    Radiological Exams on Admission: Ct Head Wo Contrast  Result Date: 04/04/2018 CLINICAL DATA:  Right facial and arm numbness.  Assess for stroke. EXAM: CT HEAD WITHOUT CONTRAST TECHNIQUE: Contiguous axial images were obtained from the base of the skull through the vertex without intravenous contrast. COMPARISON:  None. FINDINGS: Brain: No evidence of acute infarction, hemorrhage, hydrocephalus, extra-axial collection or mass lesion/mass effect. Vascular: No hyperdense vessel or unexpected calcification. Skull: Normal. Negative for fracture or focal lesion. Sinuses/Orbits: No acute finding. Other: None. IMPRESSION: Normal head CT. Electronically Signed   By: Sherian ReinWei-Chen  Lin M.D.   On: 04/04/2018 14:40     Assessment/Plan Active Problems:   Numbness and tingling   Pt coming in with facial numbness. Weakness , difficulty swallowing from past 3 days   CT head normal   CMP normal   CBC  normal   Lumbar puncture done with results pending   Will ask for B12 , TSH and folate   MRI still pending   May consider heavy metal screen.    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    [facility]    D/W Dr Petra KubaKilpatrick( Neurologist informed). Will see on arrival           DVT prophylaxis: Heparin  Code Status: Full code Family Communication: Pt husband in the room Disposition Plan: admission for [place] at pt needs neurologist Consults called: Tele neurology Admission status: inpt  Randa LynnBHUTANI,Daelan Gatt S MD [facility] Pager [phone]  If 7PM-7AM, please contact night-coverage [email or web address] Password [email or web address]  04/04/2018, 5:11 PM

## 2018-04-04 NOTE — ED Notes (Signed)
Unable to obtain blood work with IV start. Lab aware.

## 2018-04-04 NOTE — ED Notes (Signed)
Neuro cart placed at bedside for neuro consult.

## 2018-04-04 NOTE — ED Notes (Signed)
[facility] called for [facility]. Per Gala Romneyoug at [facility] transport will be after shift change. Ranae PalmsMade Brooke, RN aware.

## 2018-04-04 NOTE — Consult Note (Signed)
   [facility] TeleNeurology Consult Services  Date of Service: 04/04/2018  Impression:  1. No evidence of stroke 2. 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 Possible first attack of multiple sclerosis  Recommendations: MRI brain, diagnostic lumbar puncture to include protein, glucose, cell count, differential, routine cultures, MS profile. I discussed with ED doctor that there is no neurology coverage for the weekend and indeed he may have to transfer her to a different facility.  ---------------------------------------------------------------------  CC: numbness all four extremities  History of Present Illness: this is a 27 year old woman in good health who two days ago developed numbness on her face and arms beginning distally. It was sporadic worse on the right initially but now on both sides and persistent. She developed some numbness on her feet as well. She says the limbs are weak, worse in the arms. She has no trouble speaking but has slight trouble swallowing. She is blurred vision in the right I. She has no difficulty with bladder control. She had a rash on her throat but has not had any insect bites. She is not had a recent febrile illness. She has no history of hypertension, diabetes, coronary disease. She has asthma and uses albuterol as needed.   Diagnostic Testing: none yet  Exam:  Mental Status:  Awake, alert, oriented  Speech: fluent  Cranial Nerves: Visual fields: normal Extraocular movements: Intact in all cardinal gaze Ptosis: Absent Facial sensation: Intact and symmetric Facial movements: Intact and symmetric    Motor Exam:  adequate strength in all four extremities   Tremor/Abnormal Movements:  Resting tremor: Absent Intention tremor: Absent Postural tremor: Absent  Sensation: Pinprick: diminished distally in both upper extremity compared to proximally normal in the lowers.   Medical Decision Making:  - Extensive number of diagnosis or  management options are considered above.   - Extensive amount of complex data reviewed.   - High risk of complication and/or morbidity or mortality are associated with differential diagnostic considerations above.  - There may be uncertain outcome and increased probability of prolonged functional impairment or high probability of severe prolonged functional impairment associated with some of these differential diagnosis.   Medical Data Reviewed:  1.Data reviewed include clinical labs, radiology,  Medical Tests;   2.Tests results discussed w/performing or interpreting physician;   3.Obtaining/reviewing old medical records;  4.Obtaining case history from another source;  5.Independent review of image, tracing or specimen.    Patient was informed the Neurology Consult would happen via TeleHealth consult by way of interactive audio and video telecommunications and consented to receiving care in this manner.

## 2018-04-04 NOTE — ED Provider Notes (Signed)
[facility] Provider Note   CSN: [id number] Arrival date & time: 04/04/18  1043     History   Chief Complaint No chief complaint on file.   HPI [facility] is a 27 y.o. female.  She has no significant past medical history.  She said for the past 3 days she has been experiencing numbness in her right face and right arm but also with some numbness in her left face and left arm more intermittently.  She feels her right arm is slightly weak.  She has had some blurry vision but no double vision.  Is not been associated with any headache or trauma.  No fevers or chills no nausea vomiting diarrhea or urinary symptoms.  She is never had this distinctly before although she complains she is had some radicular pains down her right arm and right leg in the past.  It does not seem to be occurring now.  The history is provided by the patient.  Cerebrovascular Accident  This is a new problem. Episode onset: 3 days. The problem occurs constantly. The problem has not changed since onset.Pertinent negatives include no chest pain, no abdominal pain, no headaches and no shortness of breath. Nothing aggravates the symptoms. Nothing relieves the symptoms. She has tried nothing for the symptoms. The treatment provided no relief.    Past Medical History:  Diagnosis Date  . Asthma     Patient Active Problem List   Diagnosis Date Noted  . Encounter for supervision of other normal pregnancy 06/16/2016  . Asthma 06/16/2016    Past Surgical History:  Procedure Laterality Date  . WISDOM TOOTH EXTRACTION       OB History    Gravida  4   Para  3   Term  3   Preterm      AB      Living  3     SAB      TAB      Ectopic      Multiple      Live Births  3            Home Medications    Prior to Admission medications   Medication Sig Start Date End Date Taking? Authorizing Provider  albuterol (PROVENTIL HFA;VENTOLIN HFA) 108 (90 Base) MCG/ACT inhaler Inhale  into the lungs every 6 (six) hours as needed for wheezing or shortness of breath.    [provider]  ondansetron (ZOFRAN) 4 MG tablet Take 8 mg by mouth every 8 (eight) hours as needed for nausea or vomiting.    [provider]  Prenatal Vit-Fe Fumarate-FA (MULTIVITAMIN-PRENATAL) 27-0.8 MG TABS tablet Take 1 tablet by mouth daily at 12 noon.    [provider]    Family History Family History  Problem Relation Age of Onset  . Diabetes Mother   . Cancer Maternal Grandfather        melanoma  . Congestive Heart Failure Paternal Grandmother     Social History Social History   Tobacco Use  . Smoking status: Never Smoker  . Smokeless tobacco: Never Used  Substance Use Topics  . Alcohol use: No  . Drug use: No     Allergies   Dairy aid [lactase]; Sulfa antibiotics; and Latex   Review of Systems Review of Systems  Constitutional: Negative for fever.  HENT: Negative for sore throat.   Eyes: Positive for visual disturbance. Negative for pain.  Respiratory: Negative for shortness of breath.   Cardiovascular: Negative  for chest pain.  Gastrointestinal: Negative for abdominal pain.  Genitourinary: Negative for dysuria.  Musculoskeletal: Negative for neck pain.  Skin: Negative for rash.  Neurological: Positive for weakness and numbness. Negative for syncope, facial asymmetry, speech difficulty and headaches.     Physical Exam Updated Vital Signs BP (!) 125/104   Pulse 91   Temp 97.9 F (36.6 C) (Oral)   Resp 20   Ht 5' (1.524 m)   Wt 83.9 kg   LMP 04/02/2018   SpO2 100%   BMI 36.13 kg/m   Physical Exam Vitals signs and nursing note reviewed.  Constitutional:      General: She is not in acute distress.    Appearance: She is well-developed.  HENT:     Head: Normocephalic and atraumatic.  Eyes:     Extraocular Movements: Extraocular movements intact.     Conjunctiva/sclera: Conjunctivae normal.     Pupils: Pupils are equal, round, and  reactive to light.  Neck:     Musculoskeletal: Neck supple.  Cardiovascular:     Rate and Rhythm: Normal rate and regular rhythm.     Heart sounds: No murmur.  Pulmonary:     Effort: Pulmonary effort is normal. No respiratory distress.     Breath sounds: Normal breath sounds.  Abdominal:     Palpations: Abdomen is soft.     Tenderness: There is no abdominal tenderness.  Musculoskeletal: Normal range of motion.        General: No tenderness or signs of injury.  Skin:    General: Skin is warm and dry.     Capillary Refill: Capillary refill takes less than 2 seconds.  Neurological:     Mental Status: She is alert and oriented to person, place, and time.     Gait: Gait normal.     Comments: For cranial nerves she has subjective decrease sensation in all 3 dermatomes on the right.  There is decrease sensation in her right arm compared to her left.  There is normal sensation of trunk and legs.  She has no sign of weakness or pronator drift of her upper extremities although on pronator drift she did have a slight tremor of her upper extremity.  No lower extremity weakness.  Normal gait.  Psychiatric:        Mood and Affect: Mood normal.      ED Treatments / Results  Labs (all labs ordered are listed, but only abnormal results are displayed) Labs Reviewed  CSF CELL COUNT WITH DIFFERENTIAL - Abnormal; Notable for the following components:      Result Value   Appearance, CSF CLEAR (*)    RBC Count, CSF 2 (*)    All other components within normal limits  CSF CELL COUNT WITH DIFFERENTIAL - Abnormal; Notable for the following components:   Appearance, CSF CLEAR (*)    All other components within normal limits  CSF CULTURE  BASIC METABOLIC PANEL  CBC WITH DIFFERENTIAL/PLATELET  MAGNESIUM  PROTEIN AND GLUCOSE, CSF  URINALYSIS, ROUTINE W REFLEX MICROSCOPIC  PREGNANCY, URINE  MS PROFILE+MBP, CSF + BLOOD  DRAW EXTRA CLOT TUBE    EKG EKG Interpretation  Date/Time:  Tuesday April 04 2018 13:48:11 EST Ventricular Rate:  98 PR Interval:    QRS Duration: 84 QT Interval:  343 QTC Calculation: 438 R Axis:   72 Text Interpretation:  Sinus rhythm Baseline wander in lead(s) III no prior to compare with Confirmed by Meridee ScoreButler, Branda Chaudhary [phone]) on 04/04/2018 1:49:56 PM  Radiology Ct Head Wo Contrast  Result Date: 04/04/2018 CLINICAL DATA:  Right facial and arm numbness.  Assess for stroke. EXAM: CT HEAD WITHOUT CONTRAST TECHNIQUE: Contiguous axial images were obtained from the base of the skull through the vertex without intravenous contrast. COMPARISON:  None. FINDINGS: Brain: No evidence of acute infarction, hemorrhage, hydrocephalus, extra-axial collection or mass lesion/mass effect. Vascular: No hyperdense vessel or unexpected calcification. Skull: Normal. Negative for fracture or focal lesion. Sinuses/Orbits: No acute finding. Other: None. IMPRESSION: Normal head CT. Electronically Signed   By: Sherian Rein M.D.   On: 04/04/2018 14:40    Procedures .Lumbar Puncture Date/Time: 04/04/2018 3:41 PM Performed by: Terrilee Files, MD Authorized by: Terrilee Files, MD   Consent:    Consent obtained:  Written   Consent given by:  Patient   Risks discussed:  Bleeding, infection, pain, nerve damage, repeat procedure and headache   Alternatives discussed:  No treatment and delayed treatment Pre-procedure details:    Procedure purpose:  Diagnostic   Preparation: Patient was prepped and draped in usual sterile fashion   Anesthesia (see MAR for exact dosages):    Anesthesia method:  Local infiltration   Local anesthetic:  Lidocaine 2% WITH epi Procedure details:    Lumbar space:  L4-L5 interspace   Patient position:  Sitting   Needle gauge:  22   Ultrasound guidance: no     Number of attempts:  1   Fluid appearance:  Clear   Tubes of fluid:  4   Total volume (ml):  7 Post-procedure:    Puncture site:  Adhesive bandage applied   Patient tolerance of procedure:   Tolerated well, no immediate complications   (including critical care time)  Medications Ordered in ED Medications  lidocaine-EPINEPHrine (XYLOCAINE W/EPI) 2 %-1:200000 (PF) injection 10 mL (10 mLs Infiltration Given by Other 04/04/18 1555)     Initial Impression / Assessment and Plan / ED Course  I have reviewed the triage vital signs and the nursing notes.  Pertinent labs & imaging results that were available during my care of the patient were reviewed by me and considered in my medical decision making (see chart for details).  Clinical Course as of Apr 04 1857  Tue Apr 04, 2018  1336 I reviewed my findings with tele-neurology and he said he would do a tele-stroke eval on her.  He agrees with the initial work-up of lab work and CT.  He feels likely she will need to be admitted for stroke/MS work-up with an MRI with and without of her brain.   [MB]  1423 Neurology gave me a call and they feel this is possibly MS versus Guyon Teola Bradley because of her a sending symptoms versus less likely stroke.  They are recommending proceeding with an LP after the CT and with consideration for transfer to [facility] if she would need plasmapheresis versus IgG if her LP results are abnormal.   [MB]  1633 Discussed with the [facility] hospitalist who will evaluate the patient for admission versus transfer to [facility].   [MB]    Clinical Course User Index [MB] Terrilee Files, MD     Final Clinical Impressions(s) / ED Diagnoses   Final diagnoses:  Numbness and tingling of both upper extremities    ED Discharge Orders    None       Terrilee Files, MD 04/04/18 1859

## 2018-04-05 DIAGNOSIS — R200 Anesthesia of skin: Principal | ICD-10-CM

## 2018-04-05 DIAGNOSIS — R202 Paresthesia of skin: Secondary | ICD-10-CM

## 2018-04-05 DIAGNOSIS — D518 Other vitamin B12 deficiency anemias: Secondary | ICD-10-CM

## 2018-04-05 LAB — URINALYSIS, ROUTINE W REFLEX MICROSCOPIC
Bacteria, UA: NONE SEEN
Bilirubin Urine: NEGATIVE
Glucose, UA: NEGATIVE mg/dL
Ketones, ur: NEGATIVE mg/dL
Leukocytes, UA: NEGATIVE
Nitrite: NEGATIVE
Protein, ur: NEGATIVE mg/dL
RBC / HPF: >50 RBC/hpf — ABNORMAL HIGH (ref 0–5)
Specific Gravity, Urine: 1.02 (ref 1.005–1.030)
pH: 6 (ref 5.0–8.0)

## 2018-04-05 LAB — BASIC METABOLIC PANEL
Anion gap: 10 (ref 5–15)
BUN: 13 mg/dL (ref 6–20)
CO2: 25 mmol/L (ref 22–32)
Calcium: 9.3 mg/dL (ref 8.9–10.3)
Chloride: 104 mmol/L (ref 98–111)
Creatinine, Ser: 0.89 mg/dL (ref 0.44–1.00)
GFR calc Af Amer: >60 mL/min (ref >60)
GFR calc non Af Amer: >60 mL/min (ref >60)
Glucose, Bld: 92 mg/dL (ref 70–99)
Potassium: 3.9 mmol/L (ref 3.5–5.1)
Sodium: 139 mmol/L (ref 135–145)

## 2018-04-05 LAB — CBC
HCT: 39 % (ref 36.0–46.0)
Hemoglobin: 12.8 g/dL (ref 12.0–15.0)
MCH: 28.7 pg (ref 26.0–34.0)
MCHC: 32.8 g/dL (ref 30.0–36.0)
MCV: 87.4 fL (ref 80.0–100.0)
Platelets: 323 10*3/uL (ref 150–400)
RBC: 4.46 MIL/uL (ref 3.87–5.11)
RDW: 13.5 % (ref 11.5–15.5)
WBC: 6.8 10*3/uL (ref 4.0–10.5)
nRBC: 0 % (ref 0.0–0.2)

## 2018-04-05 LAB — HIV ANTIBODY (ROUTINE TESTING W REFLEX): HIV Screen 4th Generation wRfx: NONREACTIVE

## 2018-04-05 LAB — PREGNANCY, URINE: Preg Test, Ur: NEGATIVE

## 2018-04-05 MED ORDER — LORAZEPAM 2 MG/ML IJ SOLN
1.0000 mg | Freq: Once | INTRAMUSCULAR | Status: AC
  Administered 2018-04-06: 2 mg via INTRAVENOUS
  Filled 2018-04-05: qty 1

## 2018-04-05 NOTE — Progress Notes (Signed)
Pt c/o increased numbness tingling down right lower extremity.  Text paged Dr. Blake Divine at this time.  Awaiting call back.

## 2018-04-05 NOTE — Consult Note (Signed)
NEURO HOSPITALIST CONSULT NOTE   Requestig physician: Dr. Blake DivineAkula  Reason for Consult: Possible GBS  History obtained from:   Patient and Chart     HPI:                                                                                                                                          Nancy Thompson is an 28 y.o. female who presented to [facility] on Tuesday with new onset of bilateral arm and face numbness as well as dizziness for the past 3 days. She stated that her right sided grip was weaker than the left. She denied trouble swallowing, eating, or walking.   Additional information obtained from [facility]: "She has no significant past medical history.  She said for the past 3 days she has been experiencing numbness in her right face and right arm but also with some numbness in her left face and left arm more intermittently.  She feels her right arm is slightly weak.  She has had some blurry vision but no double vision.  Is not been associated with any headache or trauma.  No fevers or chills no nausea vomiting diarrhea or urinary symptoms.  She is never had this distinctly before although she complains she is had some radicular pains down her right arm and right leg in the past.  It does not seem to be occurring now."   Teleneurology was consulted and advised work up for possible GBS versus MS. The patient was then [facility] for further evaluation and management.   At the time of Neurohospitalist assessment, the patient states that all of her neurological symptoms have resolved. She states that one year ago she had an episode of right face and arm numbness that then resolved. She has at times had recrudescence of a milder version of those symptoms.   Past Medical History:  Diagnosis Date  . Asthma     Past Surgical History:  Procedure Laterality Date  . WISDOM TOOTH EXTRACTION      Family History  Problem Relation Age of Onset  . Diabetes  Mother   . Cancer Maternal Grandfather        melanoma  . Congestive Heart Failure Paternal Grandmother               Social History:  reports that she has never smoked. She has never used smokeless tobacco. She reports that she does not drink alcohol or use drugs.  Allergies  Allergen Reactions  . Dairy Aid [Lactase] Nausea Only  . Latex Rash  . Sulfa Antibiotics Rash    MEDICATIONS:  Prior to Admission:  Medications Prior to Admission  Medication Sig Dispense Refill Last Dose  . acetaminophen (TYLENOL) 500 MG tablet Take 1,000 mg by mouth every 6 (six) hours as needed for mild pain or moderate pain.   Past Week at Unknown time  . albuterol (PROVENTIL HFA;VENTOLIN HFA) 108 (90 Base) MCG/ACT inhaler Inhale into the lungs every 6 (six) hours as needed for wheezing or shortness of breath.   04/03/2018 at Unknown time  . ibuprofen (ADVIL,MOTRIN) 200 MG tablet Take 800 mg by mouth every 6 (six) hours as needed for moderate pain.   Past Week at Unknown time   Scheduled: . heparin  5,000 Units Subcutaneous Q8H  . sodium chloride flush  3 mL Intravenous Q12H     ROS:                                                                                                                                       No headache, neck pain, vision changes, facial droop, trouble with speech, confusion, CP, abdominal pain, diarrhea, saddle anesthesia or incontinence. No F/C. Other ROS as per HPI.    Blood pressure 102/74, pulse 71, temperature 98.5 F (36.9 C), temperature source Oral, resp. rate 18, height 5' (1.524 m), weight 87.5 kg, last menstrual period 04/02/2018, SpO2 99 %, unknown if currently breastfeeding.   General Examination:                                                                                                       Physical Exam  HEENT-  [place]/AT    Lungs- Respirations  unlabored Extremities- No edema  Neurological Examination Mental Status: Alert, fully oriented, thought content appropriate.  Speech fluent without evidence of aphasia.  Able to follow all commands without difficulty. Cranial Nerves: II: Visual fields intact with no extinction to DSS. PERRL.  III,IV, VI: EOMI without nystagmus. No ptosis.  V,VII: No facial droop. Temp sensation equal bilaterally VIII: hearing intact to conversation IX,X: No hypophonia XI: Symmetric XII: midline tongue extension Motor: Right : Upper extremity   5/5    Left:     Upper extremity   5/5  Lower extremity   5/5     Lower extremity   5/5 Normal tone throughout; no atrophy noted No pronator drift Sensory: Temp and light touch intact throughout, bilaterally Deep Tendon Reflexes: 3+ and symmetric bilateral biceps, brachioradialis and patellae. 2+ right achilles, trace left achilles.  Plantars: Right: downgoing   Left: downgoing Cerebellar:  No ataxia with FNF or H-S bilaterally Gait: Deferred   Lab Results: Basic Metabolic Panel: Recent Labs  Lab 04/04/18 1406 04/04/18 2118 04/05/18 0453  NA 138  --  139  K 4.0  --  3.9  CL 106  --  104  CO2 25  --  25  GLUCOSE 90  --  92  BUN 14  --  13  CREATININE 0.82 0.97 0.89  CALCIUM 9.4  --  9.3  MG 2.0  --   --     CBC: Recent Labs  Lab 04/04/18 1406 04/04/18 2118 04/05/18 0453  WBC 6.6 8.7 6.8  NEUTROABS 3.9  --   --   HGB 12.8 13.4 12.8  HCT 38.6 40.2 39.0  MCV 88.5 89.1 87.4  PLT 352 380 323    Cardiac Enzymes: No results for input(s): CKTOTAL, CKMB, CKMBINDEX, TROPONINI in the last 168 hours.  Lipid Panel: No results for input(s): CHOL, TRIG, HDL, CHOLHDL, VLDL, LDLCALC in the last 168 hours.  Imaging: Ct Head Wo Contrast  Result Date: 04/04/2018 CLINICAL DATA:  Right facial and arm numbness.  Assess for stroke. EXAM: CT HEAD WITHOUT CONTRAST TECHNIQUE: Contiguous axial images were obtained from the base of the skull through the  vertex without intravenous contrast. COMPARISON:  None. FINDINGS: Brain: No evidence of acute infarction, hemorrhage, hydrocephalus, extra-axial collection or mass lesion/mass effect. Vascular: No hyperdense vessel or unexpected calcification. Skull: Normal. Negative for fracture or focal lesion. Sinuses/Orbits: No acute finding. Other: None. IMPRESSION: Normal head CT. Electronically Signed   By: Sherian ReinWei-Chen  Lin M.D.   On: 04/04/2018 14:40    Assessment: 28 year old female with acute onset of sensory numbness with mild motor weakness 1. Current neurological exam is normal, except for decreased left achilles reflex and borderline hyperreflexia of patellae and upper extremities in the context of anxiety.  2. Exam findings not c/w GBS 3. LP at OSH was also not c/w GBS 4. Vitamin B12 level is low at 255. This could account for her symptoms. However, MS is still on the DDx   Recommendations: 1. MRI brain and cervical spine with and without contrast 2. Vitamin B12 supplementation: 2 mg po qd during this admission and as an outpatient after discharge. Repeat level in 2 months.  3. Copper level 4. Further recommendations pending results of MRI.   Electronically signed: Dr. Caryl PinaEric Hadassa Cermak 04/05/2018, 7:01 AM

## 2018-04-05 NOTE — Progress Notes (Signed)
PROGRESS NOTE    Nancy Thompson  IFO:[id number] DOB: Jan 18, 1991 DOA: 04/04/2018 PCP: Montez Hageman, DO   Brief Narrative:  Nancy Thompson is a 28 y.o. female with medical history significant of Asthma who came came to the hospital with chief complaint of facial  Numbness since Sunday, with associated symptoms of tingling and numbness of the upper extremities, dysphagia ,. Neurology consulted and recommendations given.  She underwent spinal tap and results pending.   Assessment & Plan:   Active Problems:   Numbness and tingling   Numbness  Tingling and Numbness of the upper extremities,  Differential include vitamin b12 deficiency vs MM.  Recommend MRI brain and cervical spine with and without contrast.  Vitamin b12 supplementation.  Copper level pending.  TSH wnl.  Spinal tap results negative so far.    Asthma;  No wheezing heard.        DVT prophylaxis:Heparin sq.  Code Status: (full code.  Family Communication: none at bedside.  Disposition Plan: pending clinical improvement and further evaluation.    Consultants:   Neurology.    Procedures: MRI brain with and without contrast.    Antimicrobials: none.    Subjective: Persistent tingling and numbness of the upper extremities.   Objective: Vitals:   04/04/18 2332 04/05/18 0355 04/05/18 0744 04/05/18 1214  BP: 110/72 102/74 117/80 123/79  Pulse: 65 71 76 72  Resp: 17 18 16 16   Temp: 98.2 F (36.8 C) 98.5 F (36.9 C) 98.2 F (36.8 C) 98.5 F (36.9 C)  TempSrc: Oral Oral Oral Oral  SpO2: 99% 99% 99% 100%  Weight:      Height:        Intake/Output Summary (Last 24 hours) at 04/05/2018 1249 Last data filed at 04/05/2018 1210 Gross per 24 hour  Intake 720 ml  Output -  Net 720 ml   Filed Weights   04/04/18 1115 04/04/18 2051  Weight: 83.9 kg 87.5 kg    Examination:  General exam: Appears calm and comfortable  Respiratory system: Clear to auscultation. Respiratory effort  normal. Cardiovascular system: S1 & S2 heard, RRR. No pedal edema. Gastrointestinal system: Abdomen is nondistended, soft and nontender.  Normal bowel sounds heard. Central nervous system: Alert and oriented.tingling ad numbness of the lateral aspect of the hands and fingers. Extremities: Symmetric 5 x 5 power. Skin: No rashes, lesions or ulcers Psychiatry:  Mood & affect appropriate.     Data Reviewed: I have personally reviewed following labs and imaging studies  CBC: Recent Labs  Lab 04/04/18 1406 04/04/18 2118 04/05/18 0453  WBC 6.6 8.7 6.8  NEUTROABS 3.9  --   --   HGB 12.8 13.4 12.8  HCT 38.6 40.2 39.0  MCV 88.5 89.1 87.4  PLT 352 380 323   Basic Metabolic Panel: Recent Labs  Lab 04/04/18 1406 04/04/18 2118 04/05/18 0453  NA 138  --  139  K 4.0  --  3.9  CL 106  --  104  CO2 25  --  25  GLUCOSE 90  --  92  BUN 14  --  13  CREATININE 0.82 0.97 0.89  CALCIUM 9.4  --  9.3  MG 2.0  --   --    GFR: Estimated Creatinine Clearance: 93.4 mL/min (by C-G formula based on SCr of 0.89 mg/dL). Liver Function Tests: No results for input(s): AST, ALT, ALKPHOS, BILITOT, PROT, ALBUMIN in the last 168 hours. No results for input(s): LIPASE, AMYLASE in the last 168 hours. No results for  input(s): AMMONIA in the last 168 hours. Coagulation Profile: No results for input(s): INR, PROTIME in the last 168 hours. Cardiac Enzymes: No results for input(s): CKTOTAL, CKMB, CKMBINDEX, TROPONINI in the last 168 hours. BNP (last 3 results) No results for input(s): PROBNP in the last 8760 hours. HbA1C: Recent Labs    04/04/18 2118  HGBA1C 4.7*   CBG: No results for input(s): GLUCAP in the last 168 hours. Lipid Profile: No results for input(s): CHOL, HDL, LDLCALC, TRIG, CHOLHDL, LDLDIRECT in the last 72 hours. Thyroid Function Tests: Recent Labs    04/04/18 2118  TSH 0.954   Anemia Panel: Recent Labs    04/04/18 2118  VITAMINB12 255   Sepsis Labs: No results for  input(s): PROCALCITON, LATICACIDVEN in the last 168 hours.  Recent Results (from the past 240 hour(s))  CSF culture     Status: None (Preliminary result)   Collection Time: 04/04/18  3:09 PM  Result Value Ref Range Status   Specimen Description   Final    CSF Performed at [facility], [address], [place], [place] [address]    Special Requests   Final    NONE Performed at [facility], [address] [place], [place] [address]    Gram Stain   Final    CYTOSPIN SMEAR NO WBC SEEN NO ORGANISMS SEEN Performed at [facility]    Culture   Final    NO GROWTH < 12 HOURS Performed at [facility], [address] [place], [place] [address]    Report Status PENDING  Incomplete         Radiology Studies: Ct Head Wo Contrast  Result Date: 04/04/2018 CLINICAL DATA:  Right facial and arm numbness.  Assess for stroke. EXAM: CT HEAD WITHOUT CONTRAST TECHNIQUE: Contiguous axial images were obtained from the base of the skull through the vertex without intravenous contrast. COMPARISON:  None. FINDINGS: Brain: No evidence of acute infarction, hemorrhage, hydrocephalus, extra-axial collection or mass lesion/mass effect. Vascular: No hyperdense vessel or unexpected calcification. Skull: Normal. Negative for fracture or focal lesion. Sinuses/Orbits: No acute finding. Other: None. IMPRESSION: Normal head CT. Electronically Signed   By: Sherian Rein M.D.   On: 04/04/2018 14:40        Scheduled Meds: . heparin  5,000 Units Subcutaneous Q8H  . LORazepam  1-2 mg Intravenous Once  . sodium chloride flush  3 mL Intravenous Q12H   Continuous Infusions: . sodium chloride       LOS: 1 day    Time spent: 34 minutes.     Kathlen Mody, MD [facility] Pager [phone]  If 7PM-7AM, please contact night-coverage [email or web address] Password [email or web address] 04/05/2018, 12:49 PM

## 2018-04-06 ENCOUNTER — Inpatient Hospital Stay (HOSPITAL_COMMUNITY): Payer: Medicaid Other

## 2018-04-06 LAB — FOLATE RBC
Folate, Hemolysate: 315.1 ng/mL
Folate, RBC: 818 ng/mL (ref >498)
Hematocrit: 38.5 % (ref 34.0–46.6)

## 2018-04-06 MED ORDER — VITAMIN B-12 1000 MCG PO TABS
1000.0000 ug | ORAL_TABLET | Freq: Every day | ORAL | Status: DC
  Filled 2018-04-06: qty 1

## 2018-04-06 MED ORDER — CYANOCOBALAMIN 2000 MCG PO TABS: 2000.0000 ug | ORAL_TABLET | Freq: Every day | ORAL | 0 refills | Status: AC

## 2018-04-06 MED ORDER — VITAMIN B-12 1000 MCG PO TABS
2000.0000 ug | ORAL_TABLET | Freq: Every day | ORAL | Status: DC
  Administered 2018-04-06: 2000 ug via ORAL

## 2018-04-06 MED ORDER — VITAMIN B-12 1000 MCG PO TABS
2000.0000 ug | ORAL_TABLET | Freq: Every day | ORAL | Status: DC
  Filled 2018-04-06: qty 2

## 2018-04-06 MED ORDER — GADOBUTROL 1 MMOL/ML IV SOLN
7.5000 mL | Freq: Once | INTRAVENOUS | Status: AC | PRN
  Administered 2018-04-06: 7.5 mL via INTRAVENOUS

## 2018-04-06 NOTE — Evaluation (Signed)
Clinical/Bedside Swallow Evaluation Patient Details  Name: Nancy Thompson MRN: [id number] Date of Birth: 01-07-1991  Today's Date: 04/06/2018 Time: SLP Start Time (ACUTE ONLY): 1211 SLP Stop Time (ACUTE ONLY): 1220 SLP Time Calculation (min) (ACUTE ONLY): 9 min  Past Medical History:  Past Medical History:  Diagnosis Date  . Asthma    Past Surgical History:  Past Surgical History:  Procedure Laterality Date  . WISDOM TOOTH EXTRACTION     HPI:  28 y.o. female with medical history significant for asthma who was admitted with c/o numbness bilateral UE, dysphagia. MRI negative for acute findings.  Spinal tap pending.    Assessment / Plan / Recommendation Clinical Impression  Pt presents with normal oropharyngeal function, mild decreased sensation V2 right side, CN V; no other bulbar deficits.  Voice strong, strong cough.  No s/s of aspiration. Continue current diet, thin liquids; meds whole in liquid.  No f/u needed.  SLP Visit Diagnosis: Dysphagia, unspecified (R13.10)    Aspiration Risk  No limitations    Diet Recommendation   regular, thin liquids  Medication Administration: Whole meds with liquid    Other  Recommendations     Follow up Recommendations None      Frequency and Duration            Prognosis        Swallow Study   General Date of Onset: 04/02/18 HPI: 28 y.o. female with medical history significant for asthma who was admitted with c/o numbness bilateral UE, dysphagia. MRI negative for acute findings.  Spinal tap pending.  Type of Study: Bedside Swallow Evaluation Diet Prior to this Study: Regular;Thin liquids Temperature Spikes Noted: No Respiratory Status: Room air History of Recent Intubation: No Behavior/Cognition: Alert;Cooperative;Pleasant mood Oral Cavity Assessment: Within Functional Limits Oral Care Completed by SLP: No Oral Cavity - Dentition: Adequate natural dentition Vision: Functional for self-feeding Self-Feeding Abilities: Able to  feed self Patient Positioning: Upright in bed Baseline Vocal Quality: Normal Volitional Cough: Strong Volitional Swallow: Able to elicit    Oral/Motor/Sensory Function Overall Oral Motor/Sensory Function: Mild impairment Facial Symmetry: Within Functional Limits Facial Strength: Within Functional Limits Facial Sensation: Reduced right;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 Samson Frederic, MA CCC/SLP Acute Rehabilitation Services Office number [phone] Pager [phone]

## 2018-04-06 NOTE — Care Management Note (Signed)
Case Management Note  Patient Details  Name: Nancy Thompson MRN: [id number] Date of Birth: 1990/09/11  Subjective/Objective:     Pt in with numbness and tingling. She is from home with spouse.  Pt is without insurance.  She goes to [facility] for health care and uses [facility] for her meds.  Denies transportation issues.               Action/Plan: Pt discharging home with self care. CM provided her coupon card to assist with the cost of her medication. Pt states her spouse is going to provide transportation home.   Expected Discharge Date:  04/06/18               Expected Discharge Plan:  Home/Self Care  In-House Referral:     Discharge planning Services  CM Consult, Medication Assistance  Post Acute Care Choice:    Choice offered to:     DME Arranged:    DME Agency:     HH Arranged:    HH Agency:     Status of Service:  Completed, signed off  If discussed at [employer] of Stay Meetings, dates discussed:    Additional Comments:  Kermit Balo, RN 04/06/2018, 3:42 PM

## 2018-04-06 NOTE — Progress Notes (Signed)
Chart review note  MRI brain ,c-spine and L-spine normal.   No new recs.  C/W B12 supplementation and follow outpatient neurology if desired.  -- Milon Dikes, MD [facility] Pager: [phone] If 7pm to 7am, please call on call as listed on AMION.

## 2018-04-06 NOTE — Discharge Summary (Signed)
Physician Discharge Summary  Nancy Thompson UJW:[id number] DOB: May 24, 1990 DOA: 04/04/2018  PCP: Nancy Thompson  Admit date: 04/04/2018 Discharge date: 04/06/2018  Admitted From: Home.  Disposition:   Home.    Recommendations for Outpatient Follow-up:  1. Follow up with PCP in 1-2 weeks 2. Please obtain BMP/CBC in one week Please follow up  With neurology as needed.   Discharge Condition: stable.  CODE STATUS: full code.  Diet recommendation: Heart Healthy    Brief/Interim Summary: Nancy Mageabitha Johnsonis a 28 y.o.femalewith medical history significant ofAsthma who camecame to the hospital with chief complaint of facial  Numbness since Sunday, with associated symptoms of tingling and numbness of the upper extremities, dysphagia ,. Neurology consulted and recommendations given.  She underwent spinal tap and results negative for GBS.   Discharge Diagnoses:  Active Problems:   Numbness and tingling   Numbness  Tingling and Numbness of the upper extremities,  Differential include vitamin b12 deficiency vs MM.  Recommend MRI brain and cervical spine with and without contrast. It was negative for acute changes.  Vitamin b12 supplementation added. .  Copper level pending.  TSH wnl.  Spinal tap results negative so far.    Asthma;  No wheezing heard.    Discharge Instructions  Discharge Instructions    Diet - low sodium heart healthy   Complete by:  As directed    Discharge instructions   Complete by:  As directed    Please follow up with PCP in one week.     Allergies as of 04/06/2018      Reactions   Dairy Aid [lactase] Nausea Only   Latex Rash   Sulfa Antibiotics Rash      Medication List    STOP taking these medications   ibuprofen 200 MG tablet Commonly known as:  ADVIL,MOTRIN     TAKE these medications   acetaminophen 500 MG tablet Commonly known as:  TYLENOL Take 1,000 mg by mouth every 6 (six) hours as needed for mild pain or moderate pain.    albuterol 108 (90 Base) MCG/ACT inhaler Commonly known as:  PROVENTIL HFA;VENTOLIN HFA Inhale into the lungs every 6 (six) hours as needed for wheezing or shortness of breath.   cyanocobalamin 2000 MCG tablet Take 1 tablet (2,000 mcg total) by mouth daily. Start taking on:  April 07, 2018      Follow-up Information    Nancy Thompson. Schedule an appointment as soon as possible for a visit in 1 week(s).   Specialty:  Family Medicine Contact information: [address] [place] [id number] [phone]          Allergies  Allergen Reactions  . Dairy Aid [Lactase] Nausea Only  . Latex Rash  . Sulfa Antibiotics Rash    Consultations:  Neurology.    Procedures/Studies: Ct Head Wo Contrast  Result Date: 04/04/2018 CLINICAL DATA:  Right facial and arm numbness.  Assess for stroke. EXAM: CT HEAD WITHOUT CONTRAST TECHNIQUE: Contiguous axial images were obtained from the base of the skull through the vertex without intravenous contrast. COMPARISON:  None. FINDINGS: Brain: No evidence of acute infarction, hemorrhage, hydrocephalus, extra-axial collection or mass lesion/mass effect. Vascular: No hyperdense vessel or unexpected calcification. Skull: Normal. Negative for fracture or focal lesion. Sinuses/Orbits: No acute finding. Other: None. IMPRESSION: Normal head CT. Electronically Signed   By: Nancy ReinWei-Chen  Lin M.D.   On: 04/04/2018 14:40   Mr Laqueta JeanBrain W EXWo Contrast  Result Date: 04/06/2018 CLINICAL DATA:  Numbness or tingling, paresthesia. EXAM: MRI HEAD WITHOUT AND WITH CONTRAST TECHNIQUE: Multiplanar, multiecho pulse sequences of the brain and surrounding structures were obtained without and with intravenous contrast. CONTRAST:  7.5 cc Gadavist intravenous COMPARISON:  None. FINDINGS: Brain: No infarction, hemorrhage, hydrocephalus, extra-axial collection or mass lesion. No white matter disease, atrophy, or abnormal enhancement Vascular: Major flow voids and vascular  enhancements are preserved Skull and upper cervical spine: Negative for marrow lesion Sinuses/Orbits: Negative IMPRESSION: Normal brain MRI. Electronically Signed   By: Nancy Spring M.D.   On: 04/06/2018 10:33   Mr Cervical Spine W Wo Contrast  Result Date: 04/06/2018 CLINICAL DATA:  Numbness.  Multiple sclerosis, new neurologic event. EXAM: MRI CERVICAL AND LUMBAR SPINE WITHOUT AND WITH CONTRAST TECHNIQUE: Multiplanar and multiecho pulse sequences of the cervical spine, to include the craniocervical junction and cervicothoracic junction, and lumbar spine, were obtained without and with intravenous contrast. CONTRAST:  7.5 cc Gadavist intravenous COMPARISON:  None. FINDINGS: MRI CERVICAL SPINE FINDINGS Alignment: Straightening of the cervical spine. Vertebrae: No fracture, evidence of discitis, or bone lesion. Cord: Normal signal and morphology Posterior Fossa, vertebral arteries, paraspinal tissues: Cystic structure in the ventral midline below the hyoid and insinuated in the strap muscles, 18 x 21 x 8 mm. No complicating internal nodularity or septation. No adjacent inflammation. Disc levels: Good disc height and hydration.  Negative facets.  No impingement MRI LUMBAR SPINE FINDINGS Segmentation:  Standard based on the available coverage. Alignment:  Normal Vertebrae:  No fracture, evidence of discitis, or bone lesion. Conus medullaris and cauda equina: Conus extends to the L1-2 level. Conus and cauda equina appear normal. Paraspinal and other soft tissues: Negative Disc levels: No degenerative changes or impingement. IMPRESSION: 1. Normal MRI of the cervical and lumbar spine. No visible demyelination or neuritis. 2. Uncomplicated thyroglossal duct cyst incidentally noted and described above. Electronically Signed   By: Nancy Spring M.D.   On: 04/06/2018 10:38   Mr Lumbar Spine W Wo Contrast  Result Date: 04/06/2018 CLINICAL DATA:  Numbness.  Multiple sclerosis, new neurologic event. EXAM: MRI CERVICAL  AND LUMBAR SPINE WITHOUT AND WITH CONTRAST TECHNIQUE: Multiplanar and multiecho pulse sequences of the cervical spine, to include the craniocervical junction and cervicothoracic junction, and lumbar spine, were obtained without and with intravenous contrast. CONTRAST:  7.5 cc Gadavist intravenous COMPARISON:  None. FINDINGS: MRI CERVICAL SPINE FINDINGS Alignment: Straightening of the cervical spine. Vertebrae: No fracture, evidence of discitis, or bone lesion. Cord: Normal signal and morphology Posterior Fossa, vertebral arteries, paraspinal tissues: Cystic structure in the ventral midline below the hyoid and insinuated in the strap muscles, 18 x 21 x 8 mm. No complicating internal nodularity or septation. No adjacent inflammation. Disc levels: Good disc height and hydration.  Negative facets.  No impingement MRI LUMBAR SPINE FINDINGS Segmentation:  Standard based on the available coverage. Alignment:  Normal Vertebrae:  No fracture, evidence of discitis, or bone lesion. Conus medullaris and cauda equina: Conus extends to the L1-2 level. Conus and cauda equina appear normal. Paraspinal and other soft tissues: Negative Disc levels: No degenerative changes or impingement. IMPRESSION: 1. Normal MRI of the cervical and lumbar spine. No visible demyelination or neuritis. 2. Uncomplicated thyroglossal duct cyst incidentally noted and described above. Electronically Signed   By: Nancy Spring M.D.   On: 04/06/2018 10:38     Subjective:  No chest pain .or sob.  Repeat vitals wnl.   Discharge Exam: Vitals:   04/06/18 0316 04/06/18 1130  BP: Marland Kitchen)  107/58 (!) 130/107  Pulse: 80 (!) 121  Resp: 16 18  Temp: 98.2 F (36.8 C) 98.6 F (37 C)  SpO2: 98% 100%   Vitals:   04/05/18 1929 04/05/18 2318 04/06/18 0316 04/06/18 1130  BP: 131/89  (!) 107/58 (!) 130/107  Pulse: 74 73 80 (!) 121  Resp: 16 16 16 18   Temp: 98.4 F (36.9 C) 98.5 F (36.9 C) 98.2 F (36.8 C) 98.6 F (37 C)  TempSrc: Oral Oral Oral    SpO2: 98% 99% 98% 100%  Weight:      Height:        General: Pt is alert, awake, not in acute distress Cardiovascular: RRR, S1/S2 +, no rubs, no gallops Respiratory: CTA bilaterally, no wheezing, no rhonchi Abdominal: Soft, NT, ND, bowel sounds + Extremities: no edema, no cyanosis    The results of significant diagnostics from this hospitalization (including imaging, microbiology, ancillary and laboratory) are listed below for reference.     Microbiology: Recent Results (from the past 240 hour(s))  CSF culture     Status: None (Preliminary result)   Collection Time: 04/04/18  3:09 PM  Result Value Ref Range Status   Specimen Description   Final    CSF Performed at [facility], [address] [place], [place] [address]    Special Requests   Final    NONE Performed at [facility], [address], [place], [place] [address]    Gram Stain   Final    CYTOSPIN SMEAR NO WBC SEEN NO ORGANISMS SEEN Performed at [facility]    Culture   Final    NO GROWTH 2 DAYS Performed at [facility], [address], [place], [place] [address]    Report Status PENDING  Incomplete     Labs: BNP (last 3 results) No results for input(s): BNP in the last 8760 hours. Basic Metabolic Panel: Recent Labs  Lab 04/04/18 1406 04/04/18 2118 04/05/18 0453  NA 138  --  139  K 4.0  --  3.9  CL 106  --  104  CO2 25  --  25  GLUCOSE 90  --  92  BUN 14  --  13  CREATININE 0.82 0.97 0.89  CALCIUM 9.4  --  9.3  MG 2.0  --   --    Liver Function Tests: No results for input(s): AST, ALT, ALKPHOS, BILITOT, PROT, ALBUMIN in the last 168 hours. No results for input(s): LIPASE, AMYLASE in the last 168 hours. No results for input(s): AMMONIA in the last 168 hours. CBC: Recent Labs  Lab 04/04/18 1406 04/04/18 2118 04/05/18 0453  WBC 6.6 8.7 6.8  NEUTROABS 3.9  --   --   HGB 12.8 13.4 12.8  HCT 38.6 40.2  38.5 39.0  MCV 88.5 89.1 87.4  PLT 352 380 323   Cardiac  Enzymes: No results for input(s): CKTOTAL, CKMB, CKMBINDEX, TROPONINI in the last 168 hours. BNP: Invalid input(s): POCBNP CBG: No results for input(s): GLUCAP in the last 168 hours. D-Dimer No results for input(s): DDIMER in the last 72 hours. Hgb A1c Recent Labs    04/04/18 2118  HGBA1C 4.7*   Lipid Profile No results for input(s): CHOL, HDL, LDLCALC, TRIG, CHOLHDL, LDLDIRECT in the last 72 hours. Thyroid function studies Recent Labs    04/04/18 2118  TSH 0.954   Anemia work up Recent Labs    04/04/18 2118  VITAMINB12 255   Urinalysis    Component Value Date/Time  COLORURINE YELLOW 04/05/2018 1845   APPEARANCEUR HAZY (A) 04/05/2018 1845   APPEARANCEUR Turbid (A) 06/16/2016 1241   LABSPEC 1.020 04/05/2018 1845   PHURINE 6.0 04/05/2018 1845   GLUCOSEU NEGATIVE 04/05/2018 1845   HGBUR LARGE (A) 04/05/2018 1845   BILIRUBINUR NEGATIVE 04/05/2018 1845   BILIRUBINUR Negative 06/16/2016 1241   KETONESUR NEGATIVE 04/05/2018 1845   PROTEINUR NEGATIVE 04/05/2018 1845   UROBILINOGEN 0.2 09/25/2009 0705   NITRITE NEGATIVE 04/05/2018 1845   LEUKOCYTESUR NEGATIVE 04/05/2018 1845   LEUKOCYTESUR Negative 06/16/2016 1241   Sepsis Labs Invalid input(s): PROCALCITONIN,  WBC,  LACTICIDVEN Microbiology Recent Results (from the past 240 hour(s))  CSF culture     Status: None (Preliminary result)   Collection Time: 04/04/18  3:09 PM  Result Value Ref Range Status   Specimen Description   Final    CSF Performed at [facility], [address], [place], [place] [address]    Special Requests   Final    NONE Performed at [facility], [address] [place], [place] [address]    Gram Stain   Final    CYTOSPIN SMEAR NO WBC SEEN NO ORGANISMS SEEN Performed at [facility]    Culture   Final    NO GROWTH 2 DAYS Performed at [facility], [address], [place], [place] [address]    Report Status PENDING  Incomplete     Time coordinating discharge: 32  minutes  SIGNED:   Kathlen Mody, MD  [facility] 04/06/2018, 3:37 PM Pager   If 7PM-7AM, please contact night-coverage [email or web address] Password [email or web address]

## 2018-04-06 NOTE — Progress Notes (Signed)
Pt discharge education and instructions completed with pt; pt voices understanding and denies any questions. Pt IV and telemetry removed; pt discharge home with spouse to transport her home. Pt to pick up electronically sent prescriptions from preferred pharmacy on file. Pt transported off unit via wheelchair with belongings to the side. Dionne Bucy RN

## 2018-04-06 NOTE — Progress Notes (Signed)
Pt stated Vitamin B makes heart rate increase. Pt's heart rate elevated in 110-120 range. Will hold med until heart rate decreases.

## 2018-04-08 LAB — CSF CULTURE W GRAM STAIN

## 2018-04-08 LAB — CSF CULTURE: CULTURE: NO GROWTH

## 2018-04-13 LAB — MS PROFILE+MBP, CSF + BLOOD
Albumin CSF-mCnc: 12 mg/dL (ref 11–48)
Albumin: 4.6 g/dL (ref 3.5–5.5)
CSF IgG Index: 0.5 (ref 0.0–0.7)
CSF/Serum Alb. Index: 3 (ref 0–8)
IGG (IMMUNOGLOBIN G), SERUM: 1111 mg/dL (ref 700–1600)
IgG Synthetic Rate: 3.4 mg/d
IgG, CSF: 1.5 mg/dL (ref 0.0–8.6)
IgG/Alb Ratio, CSF: 0.13 (ref 0.00–0.25)
MYELIN BASIC PROTEIN: 0.4 ng/mL (ref 0.0–2.9)

## 2018-06-05 NOTE — Telephone Encounter (Signed)
Note sent to nurse. 

## 2020-03-25 IMAGING — MR MR LUMBAR SPINE WO/W CM
17 of 28 series · 20 of 48 positions shown · IV contrast (gadavist)
Comparison: None.

CLINICAL DATA: Numbness.  Multiple sclerosis, new neurologic event.

EXAM:
MRI CERVICAL AND LUMBAR SPINE WITHOUT AND WITH CONTRAST
TECHNIQUE: Multiplanar and multiecho pulse sequences of the cervical spine, to
include the craniocervical junction and cervicothoracic junction,
and lumbar spine, were obtained without and with intravenous
contrast.
CONTRAST:  7.5 cc Gadavist intravenous

[Series 3: DWI · axial · 3.0mm · 0.94mm/px · 1 of 100 slices shown]
[im 1/100]
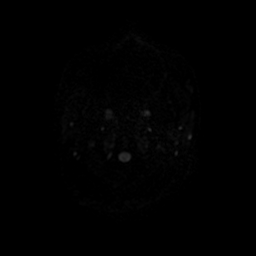

[Series 7: T2 · axial · 5.0mm · 0.47mm/px · 1 of 25 slices shown (1 of 6)]
[im 1/25]
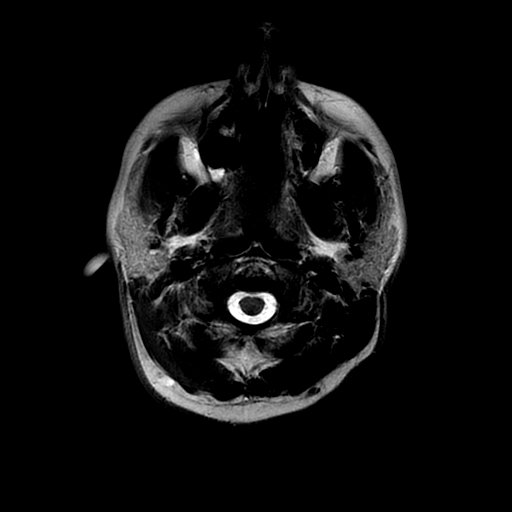

[Series 12: T2 · coronal · 5.0mm · 0.39mm/px · 1 of 29 slices shown (2 of 6)]
[im 1/29]
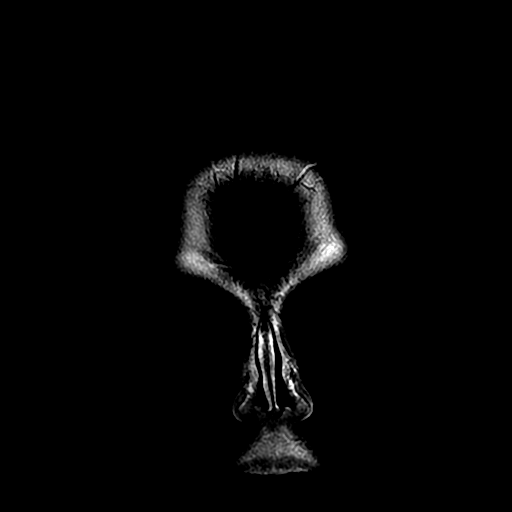

[Series 14: T2 · sagittal · 3.0mm · 0.43mm/px · 1 of 16 slices shown (3 of 6)]
[im 1/16]
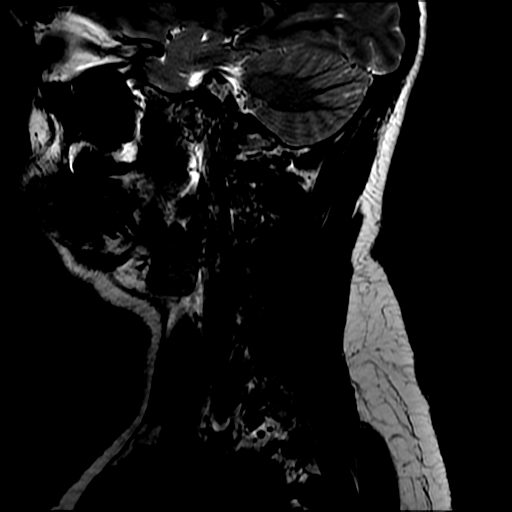

[Series 16: STIR · sagittal · 3.0mm · 0.43mm/px · 1 of 16 slices shown (1 of 2)]
[im 1/16]
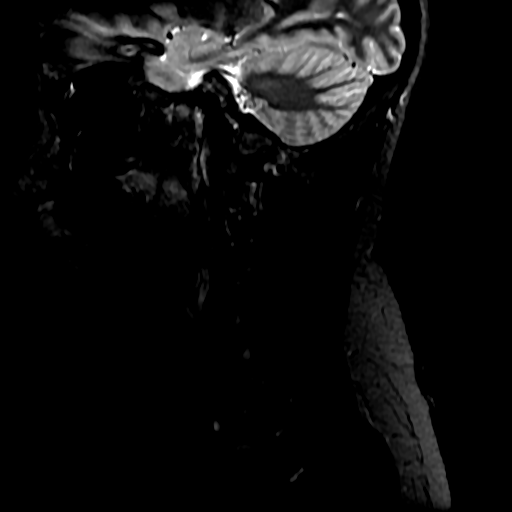

[Series 18: T2 · axial · 3.0mm · 0.35mm/px · 1 of 34 slices shown (4 of 6)]
[im 1/34]
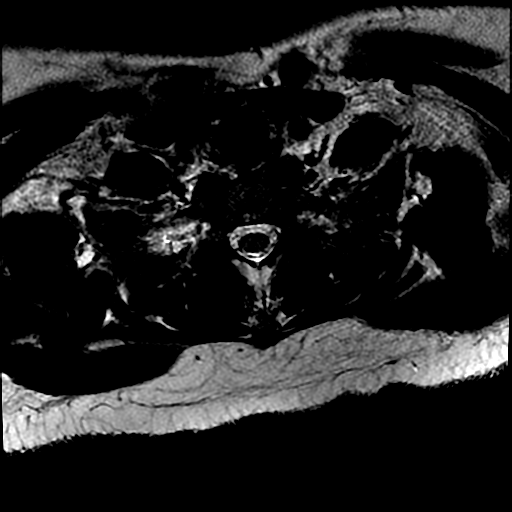

[Series 19: T1 · axial · non-contrast · 3.0mm · 0.35mm/px · 1 of 34 slices shown (1 of 4)]
[im 1/34]
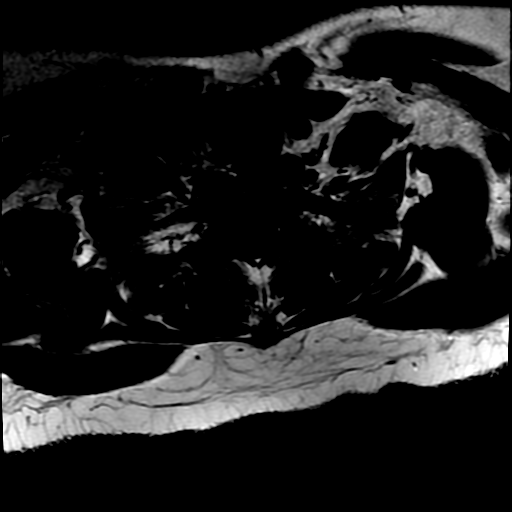

[Series 22: T2 · sagittal · 4.0mm · 0.55mm/px · 1 of 12 slices shown (5 of 6)]
[im 1/12]
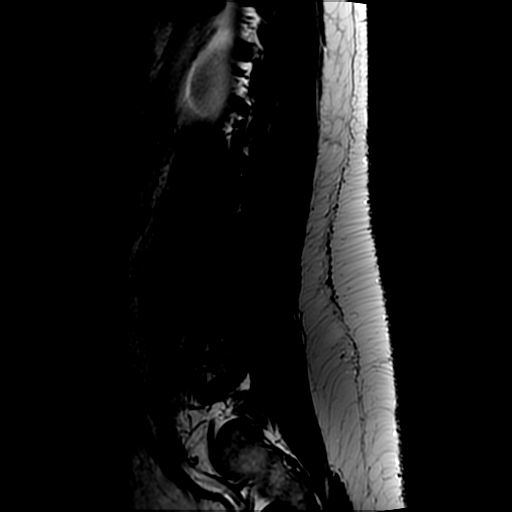

[Series 23: STIR · sagittal · 4.0mm · 0.55mm/px · 1 of 12 slices shown (2 of 2)]
[im 1/12]
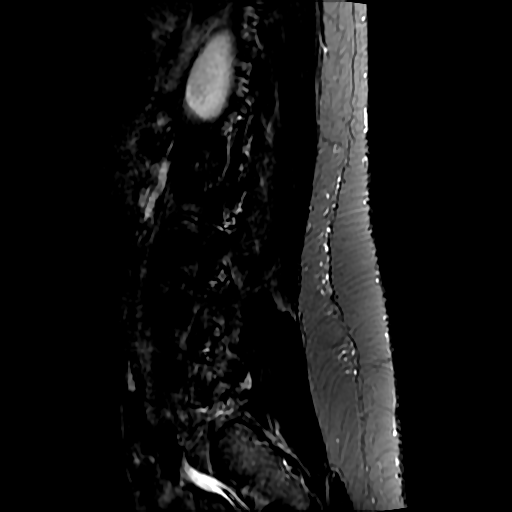

[Series 24: T1 · sagittal · 4.0mm · 0.55mm/px · 1 of 12 slices shown (2 of 4)]
[im 1/12]
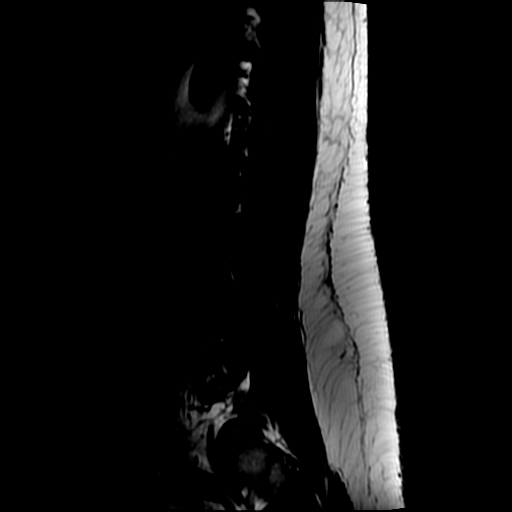

[Series 25: T2 · axial · 4.0mm · 0.39mm/px · z∈[-607,-403]mm · 2 of 37 slices shown (6 of 6)]
[im 1/37]
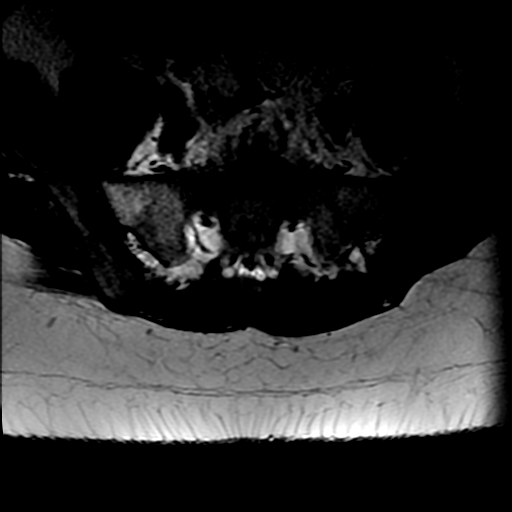
[im 37/37]
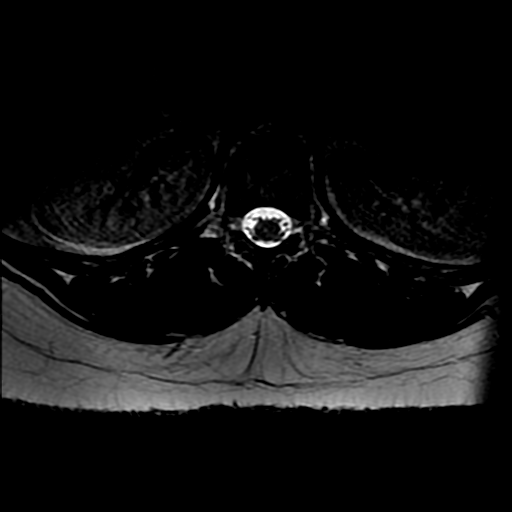

[Series 26: T1 · axial · 4.0mm · 0.39mm/px · z∈[-607,-403]mm · 2 of 37 slices shown (3 of 4)]
[im 1/37]
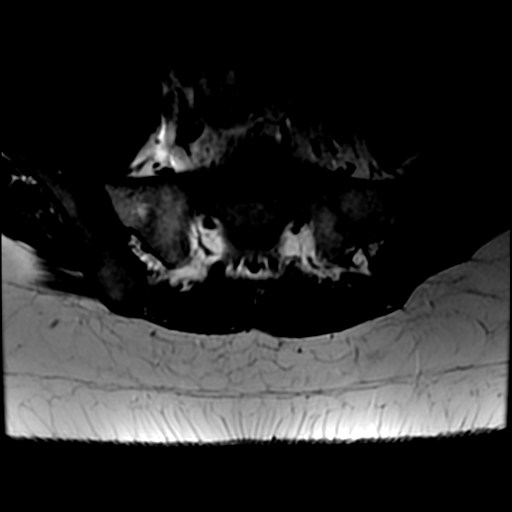
[im 37/37]
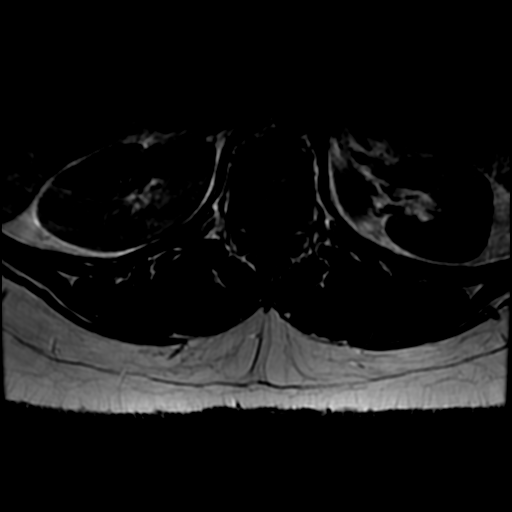

[Series 27: T1 fat-sat post-contrast · sagittal · 4.0mm · 0.55mm/px · 1 of 12 slices shown]
[im 1/12]
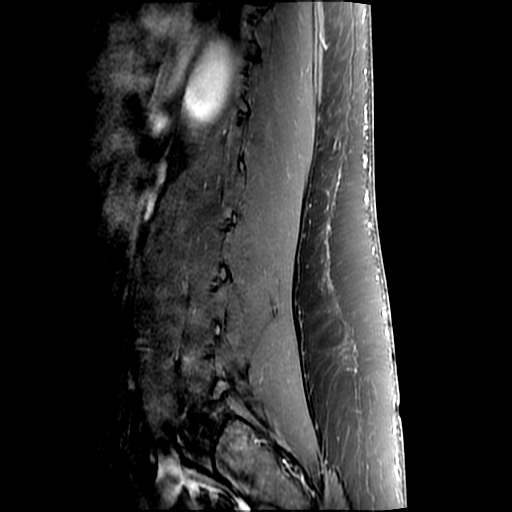

[Series 28: T1 post-contrast · axial · 4.0mm · 0.39mm/px · z∈[-607,-403]mm · 2 of 37 slices shown (1 of 3)]
[im 1/37]
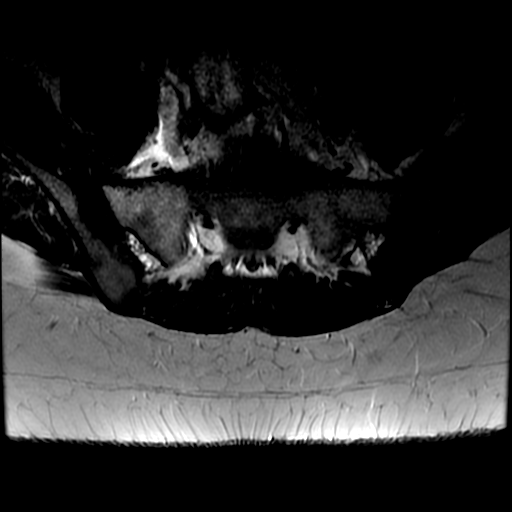
[im 37/37]
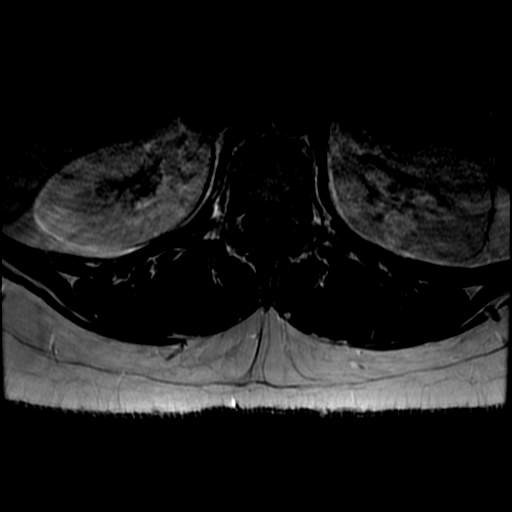

[Series 30: T1 · coronal · 5.0mm · 0.39mm/px · 1 of 30 slices shown (4 of 4)]
[im 1/30]
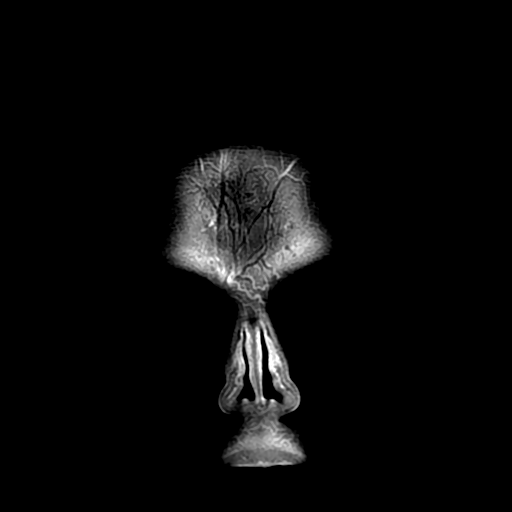

[Series 31: T1 post-contrast · sagittal · 3.0mm · 0.43mm/px · 1 of 16 slices shown (2 of 3)]
[im 1/16]
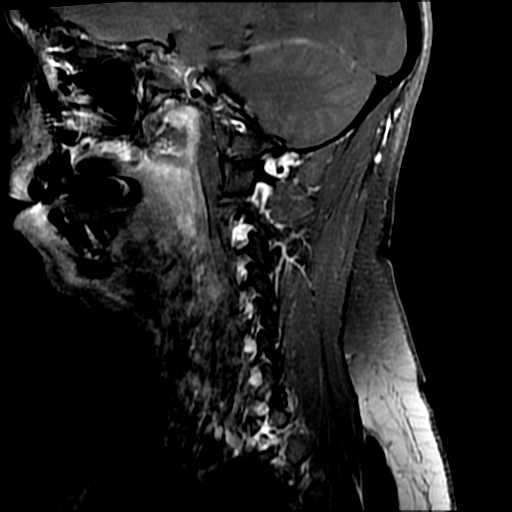

[Series 32: T1 post-contrast · axial · 3.0mm · 0.35mm/px · 1 of 34 slices shown (3 of 3)]
[im 1/34]
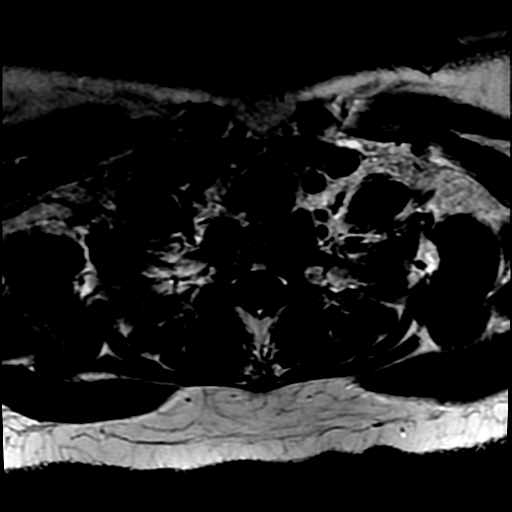

[20 of 48 positions shown; findings below may reference images not displayed]

FINDINGS: MRI CERVICAL SPINE FINDINGS

Alignment: Straightening of the cervical spine.

Vertebrae: No fracture, evidence of discitis, or bone lesion.

Cord: Normal signal and morphology

Posterior Fossa, vertebral arteries, paraspinal tissues: Cystic
structure in the ventral midline below the hyoid and insinuated in
the strap muscles, 18 x 21 x 8 mm. No complicating internal
nodularity or septation. No adjacent inflammation.

Disc levels:

Good disc height and hydration.  Negative facets.  No impingement

MRI LUMBAR SPINE FINDINGS

Segmentation:  Standard based on the available coverage.

Alignment:  Normal

Vertebrae:  No fracture, evidence of discitis, or bone lesion.

Conus medullaris and cauda equina: Conus extends to the L1-2 level.
Conus and cauda equina appear normal.

Paraspinal and other soft tissues: Negative

Disc levels:

No degenerative changes or impingement.
IMPRESSION: 1. Normal MRI of the cervical and lumbar spine. No visible
demyelination or neuritis.
2. Uncomplicated thyroglossal duct cyst incidentally noted and
described above.
